# Patient Record
Sex: Female | Born: 1965 | Race: White | Hispanic: No | Marital: Married | Smoking: Never smoker
Health system: Southern US, Community
[De-identification: ages and names within clinical notes are randomized; demographics above are authoritative.]

## PROBLEM LIST (undated history)

## (undated) DIAGNOSIS — Z98811 Dental restoration status: Secondary | ICD-10-CM

## (undated) DIAGNOSIS — K589 Irritable bowel syndrome without diarrhea: Secondary | ICD-10-CM

## (undated) DIAGNOSIS — K7581 Nonalcoholic steatohepatitis (NASH): Secondary | ICD-10-CM

## (undated) DIAGNOSIS — E781 Pure hyperglyceridemia: Secondary | ICD-10-CM

## (undated) DIAGNOSIS — J309 Allergic rhinitis, unspecified: Secondary | ICD-10-CM

## (undated) DIAGNOSIS — K635 Polyp of colon: Secondary | ICD-10-CM

## (undated) DIAGNOSIS — R2230 Localized swelling, mass and lump, unspecified upper limb: Secondary | ICD-10-CM

## (undated) DIAGNOSIS — F419 Anxiety disorder, unspecified: Secondary | ICD-10-CM

## (undated) DIAGNOSIS — J189 Pneumonia, unspecified organism: Secondary | ICD-10-CM

## (undated) DIAGNOSIS — K5730 Diverticulosis of large intestine without perforation or abscess without bleeding: Secondary | ICD-10-CM

## (undated) DIAGNOSIS — Z87442 Personal history of urinary calculi: Secondary | ICD-10-CM

## (undated) DIAGNOSIS — I10 Essential (primary) hypertension: Secondary | ICD-10-CM

## (undated) DIAGNOSIS — N3010 Interstitial cystitis (chronic) without hematuria: Secondary | ICD-10-CM

## (undated) DIAGNOSIS — K219 Gastro-esophageal reflux disease without esophagitis: Secondary | ICD-10-CM

## (undated) DIAGNOSIS — K649 Unspecified hemorrhoids: Secondary | ICD-10-CM

## (undated) DIAGNOSIS — E282 Polycystic ovarian syndrome: Secondary | ICD-10-CM

## (undated) DIAGNOSIS — K8020 Calculus of gallbladder without cholecystitis without obstruction: Secondary | ICD-10-CM

## (undated) HISTORY — DX: Polyp of colon: K63.5

## (undated) HISTORY — PX: COLONOSCOPY: SHX174

## (undated) HISTORY — DX: Gastro-esophageal reflux disease without esophagitis: K21.9

## (undated) HISTORY — DX: Allergic rhinitis, unspecified: J30.9

## (undated) HISTORY — DX: Nonalcoholic steatohepatitis (NASH): K75.81

## (undated) HISTORY — DX: Interstitial cystitis (chronic) without hematuria: N30.10

## (undated) HISTORY — DX: Anxiety disorder, unspecified: F41.9

## (undated) HISTORY — DX: Irritable bowel syndrome, unspecified: K58.9

## (undated) HISTORY — PX: ABDOMINAL HYSTERECTOMY: SHX81

## (undated) HISTORY — DX: Polycystic ovarian syndrome: E28.2

## (undated) HISTORY — DX: Calculus of gallbladder without cholecystitis without obstruction: K80.20

## (undated) HISTORY — DX: Essential (primary) hypertension: I10

## (undated) HISTORY — PX: CHOLECYSTECTOMY: SHX55

## (undated) HISTORY — DX: Pneumonia, unspecified organism: J18.9

---

## 2000-08-30 ENCOUNTER — Ambulatory Visit (HOSPITAL_COMMUNITY): Admission: RE | Admit: 2000-08-30 | Discharge: 2000-08-30 | Payer: Self-pay | Admitting: Pulmonary Disease

## 2001-10-06 ENCOUNTER — Ambulatory Visit (HOSPITAL_COMMUNITY): Admission: RE | Admit: 2001-10-06 | Discharge: 2001-10-06 | Payer: Self-pay | Admitting: Pulmonary Disease

## 2004-11-03 ENCOUNTER — Ambulatory Visit (HOSPITAL_COMMUNITY): Admission: RE | Admit: 2004-11-03 | Discharge: 2004-11-03 | Payer: Self-pay | Admitting: Family Medicine

## 2004-11-15 ENCOUNTER — Ambulatory Visit (HOSPITAL_COMMUNITY): Admission: RE | Admit: 2004-11-15 | Discharge: 2004-11-15 | Payer: Self-pay | Admitting: Family Medicine

## 2004-11-24 ENCOUNTER — Ambulatory Visit (HOSPITAL_COMMUNITY): Admission: RE | Admit: 2004-11-24 | Discharge: 2004-11-24 | Payer: Self-pay | Admitting: General Surgery

## 2005-01-05 ENCOUNTER — Encounter (INDEPENDENT_AMBULATORY_CARE_PROVIDER_SITE_OTHER): Payer: Self-pay | Admitting: General Surgery

## 2005-01-05 ENCOUNTER — Observation Stay (HOSPITAL_COMMUNITY): Admission: RE | Admit: 2005-01-05 | Discharge: 2005-01-06 | Payer: Self-pay | Admitting: General Surgery

## 2005-01-05 HISTORY — PX: CHOLECYSTECTOMY: SHX55

## 2005-01-05 HISTORY — PX: LIVER BIOPSY: SHX301

## 2005-01-08 ENCOUNTER — Ambulatory Visit (HOSPITAL_COMMUNITY): Admission: RE | Admit: 2005-01-08 | Discharge: 2005-01-08 | Payer: Self-pay | Admitting: General Surgery

## 2005-02-26 ENCOUNTER — Ambulatory Visit: Payer: Self-pay | Admitting: Internal Medicine

## 2005-03-13 ENCOUNTER — Ambulatory Visit (HOSPITAL_COMMUNITY): Admission: RE | Admit: 2005-03-13 | Discharge: 2005-03-13 | Payer: Self-pay | Admitting: Family Medicine

## 2005-12-18 ENCOUNTER — Ambulatory Visit (HOSPITAL_COMMUNITY): Admission: RE | Admit: 2005-12-18 | Discharge: 2005-12-18 | Payer: Self-pay | Admitting: Family Medicine

## 2008-11-17 ENCOUNTER — Ambulatory Visit (HOSPITAL_COMMUNITY): Admission: RE | Admit: 2008-11-17 | Discharge: 2008-11-17 | Payer: Self-pay | Admitting: Family Medicine

## 2009-02-01 ENCOUNTER — Ambulatory Visit (HOSPITAL_COMMUNITY): Admission: RE | Admit: 2009-02-01 | Discharge: 2009-02-01 | Payer: Self-pay | Admitting: Family Medicine

## 2009-03-16 ENCOUNTER — Encounter (INDEPENDENT_AMBULATORY_CARE_PROVIDER_SITE_OTHER): Payer: Self-pay | Admitting: Obstetrics and Gynecology

## 2009-03-16 ENCOUNTER — Inpatient Hospital Stay (HOSPITAL_COMMUNITY): Admission: RE | Admit: 2009-03-16 | Discharge: 2009-03-18 | Payer: Self-pay | Admitting: Obstetrics and Gynecology

## 2009-03-16 HISTORY — PX: TOTAL ABDOMINAL HYSTERECTOMY W/ BILATERAL SALPINGOOPHORECTOMY: SHX83

## 2009-03-17 ENCOUNTER — Ambulatory Visit: Payer: Self-pay | Admitting: Gynecology

## 2010-06-12 ENCOUNTER — Encounter: Payer: Self-pay | Admitting: Family Medicine

## 2010-08-24 LAB — BASIC METABOLIC PANEL
BUN: 5 mg/dL — ABNORMAL LOW (ref 6–23)
CO2: 30 mEq/L (ref 19–32)
Creatinine, Ser: 0.7 mg/dL (ref 0.4–1.2)
GFR calc Af Amer: >60 mL/min (ref >60)
Potassium: 2.9 mEq/L — ABNORMAL LOW (ref 3.5–5.1)
Sodium: 135 mEq/L (ref 135–145)

## 2010-08-24 LAB — CBC
HCT: 41.8 % (ref 36.0–46.0)
Hemoglobin: 11.3 g/dL — ABNORMAL LOW (ref 12.0–15.0)
Hemoglobin: 14 g/dL (ref 12.0–15.0)
MCHC: 33.5 g/dL (ref 30.0–36.0)
MCHC: 34 g/dL (ref 30.0–36.0)
Platelets: 312 10*3/uL (ref 150–400)
RDW: 13.6 % (ref 11.5–15.5)
WBC: 8.7 10*3/uL (ref 4.0–10.5)

## 2010-10-06 NOTE — H&P (Signed)
NAMESKYLENE, DEREMER                 ACCOUNT NO.:  [id number]   MEDICAL RECORD NO.:  [id number]          PATIENT TYPE:  AMB   LOCATION:  DAY                           FACILITY:  [facility]   PHYSICIAN:  Jerolyn Shin C. Katrinka Blazing, M.D.   DATE OF BIRTH:  12-28-1965   DATE OF ADMISSION:  DATE OF DISCHARGE:  LH                                HISTORY & PHYSICAL   A 45 year old female with a history of elevated liver function studies since  March, 2006.  She is hepatitis B and C negative.  She has had multiple  gallstones.  She has had occasional episodes of right upper quadrant  postprandial pain.  She has not had severe attacks.  The patient had an MRCP  which shows no evidence of ductal stones.  No dilated ducts.  No pancreatic  masses.  She has fatty changes of the liver.  She is scheduled to have  laparoscopic cholecystectomy and liver biopsy.  Cholangiogram will not be  done because of totally normal MRCP within the last month.   PAST MEDICAL HISTORY:  She has hypertension and seasonal allergies.   Her only medication is Dyazide 1 daily.   ALLERGIES:  None.   PAST SURGICAL HISTORY:  C-section x2.   SOCIAL HISTORY:  She is married and employed.  Does not drink, smoke, or use  drugs.   FAMILY HISTORY:  Positive for hypertension, diabetes mellitus, kidney  failure, leukemia, and a stroke.   PHYSICAL EXAMINATION:  VITAL SIGNS:  Blood pressure 150/90, pulse 90,  respirations 20. Weight 209 pounds.  HEENT:  Unremarkable.  There is no jaundice.  NECK:  Supple.  No JVD, bruit, adenopathy, or thyromegaly.  CHEST:  Clear to auscultation.  HEART:  Regular rate and rhythm without murmur, rub or gallop.  ABDOMEN:  Soft and nontender.  No masses.  EXTREMITIES:  No clubbing, cyanosis or edema.  NEUROLOGIC:  No focal motor, sensory, or cerebellar deficit.   IMPRESSION:  1.  Cholelithiasis and chronic cholecystitis.  2.  Chronically elevated liver function tests.  3.  Hypertension.   PLAN:  Laparoscopic  cholecystectomy with liver biopsy.      Dirk Dress. Katrinka Blazing, M.D.  Electronically Signed     LCS/MEDQ  D:  01/04/2005  T:  01/04/2005  Job:  [id number]   cc:   Donna Bernard, M.D.  [address]  [place]  [place] [address]  Fax: [phone]

## 2010-10-06 NOTE — Procedures (Signed)
[facility]  Patient:    TIRZA, SENTENO Visit Number: [id number] MRN: [id number]          Service Type: OUT Location: RAD Attending Physician:  Fredirick Maudlin Dictated by:   Deweese Bing, M.D. Proc. Date: 10/06/01 Admit Date:  10/06/2001 Discharge Date: 10/06/2001                              Echocardiograms  INDICATIONS:  A 45 year old woman with edema and hypertension.  1. Technically adequate echocardiographic study. 2. Normal left atrium, right atrium, and right ventricle. 3. Normal aortic, mitral, tricuspid, and pulmonic valves.  Trivial tricuspid    regurgitation with normal estimated RV systolic pressure. 4. Normal internal dimension of the left ventricle, borderline concentric LVH,    normal regional and global LV systolic function. Dictated by:   [place] Bing, M.D. Attending Physician:  Fredirick Maudlin DD:  10/06/01 TD:  10/08/01 Job: [id number]

## 2010-10-06 NOTE — Op Note (Signed)
Isabella Juarez, Isabella Juarez                 ACCOUNT NO.:  [id number]   MEDICAL RECORD NO.:  [id number]          PATIENT TYPE:  OBV   LOCATION:  A303                          FACILITY:  [facility]   PHYSICIAN:  Dirk Dress. Katrinka Blazing, M.D.   DATE OF BIRTH:  August 30, 1965   DATE OF PROCEDURE:  01/05/2005  DATE OF DISCHARGE:                                 OPERATIVE REPORT   PREOPERATIVE DIAGNOSIS:  Cholelithiasis, cholecystitis, chronically elevated  liver function studies.   POSTOPERATIVE DIAGNOSIS:  Cholelithiasis, cholecystitis, with probable fatty  infiltration of the liver.   PROCEDURE:  Laparoscopic cholecystectomy with liver biopsy.   SURGEON:  Dr. Katrinka Blazing.   INDICATIONS:  A 45 year old female with history of gallstone disease with  documented gallstones but also with chronically elevated LFTs. She had a  negative MRCP without any evidence of ductal dilatation or stones in the  duct.   DESCRIPTION:  Under general anesthesia, the patient's abdomen was prepped  and draped in sterile field. Supraumbilical incision was made, and Veress  needle was inserted uneventfully. Abdomen was insufflated with 3 liters of  CO2. Using a Visiport guide, a 10-mm port was placed. Laparoscope was  placed, and gallbladder was visualized. Under videoscopic guidance, a 10-mm  port and two 5-mm ports were placed in the right subcostal region. The  gallbladder was grasped and positioned. The patient was positioned in  reversed Trendelenburg position. Cystic duct was dissected, clipped with  five clips and divided. The cystic artery had two branches. Each was  dissected, clipped with four clips and divided. Using electrocautery,  gallbladder was separated from the intrahepatic bed without difficulty.  Next, the abdomen was desufflated. A small incision was made in the right  upper quadrant, and a Tru-Cut needle was passed through this incision. Under  videoscopic guidance, two ____________ of tissue were taken from the right  lobe of the liver. The area of the liver biopsy were cauterized until there  was no bleeding. Irrigation was carried out until the fluid returned clear.  The bed was inspected. It was clear of any bleeding. The gallbladder was  placed in an EndoCatch device and retrieved. Reinspection revealed no  evidence of bleeding and no evidence of bile leak. CO2 was allowed to escape  from the abdomen, and the ports were removed. The incisions  were closed using 0 Vicryl on the fascia of the supraumbilical incision and  staples on the skin. Dressings were placed. The patient was awakened from  anesthesia uneventfully, transferred to a bed, and taken to the post  anesthetic care unit for further monitoring.      Dirk Dress. Katrinka Blazing, M.D.  Electronically Signed     LCS/MEDQ  D:  01/05/2005  T:  01/05/2005  Job:  [id number]   cc:   Donna Bernard, M.D.  [address]  [place]  [place] [address]  Fax: [phone]

## 2011-03-14 ENCOUNTER — Other Ambulatory Visit: Payer: Self-pay | Admitting: Family Medicine

## 2011-03-14 DIAGNOSIS — R748 Abnormal levels of other serum enzymes: Secondary | ICD-10-CM

## 2011-03-20 ENCOUNTER — Other Ambulatory Visit: Payer: Self-pay | Admitting: Family Medicine

## 2011-03-20 ENCOUNTER — Ambulatory Visit (HOSPITAL_COMMUNITY)
Admission: RE | Admit: 2011-03-20 | Discharge: 2011-03-20 | Disposition: A | Discharge status: Home / Self Care | Payer: BC Managed Care – PPO | Source: Ambulatory Visit | Attending: Family Medicine | Admitting: Family Medicine

## 2011-03-20 DIAGNOSIS — R748 Abnormal levels of other serum enzymes: Secondary | ICD-10-CM

## 2011-03-20 DIAGNOSIS — R160 Hepatomegaly, not elsewhere classified: Secondary | ICD-10-CM | POA: Insufficient documentation

## 2011-04-23 ENCOUNTER — Encounter (INDEPENDENT_AMBULATORY_CARE_PROVIDER_SITE_OTHER): Payer: Self-pay | Admitting: Internal Medicine

## 2011-04-23 ENCOUNTER — Ambulatory Visit (INDEPENDENT_AMBULATORY_CARE_PROVIDER_SITE_OTHER): Payer: BC Managed Care – PPO | Admitting: Internal Medicine

## 2011-04-23 DIAGNOSIS — K7689 Other specified diseases of liver: Secondary | ICD-10-CM

## 2011-04-23 DIAGNOSIS — K760 Fatty (change of) liver, not elsewhere classified: Secondary | ICD-10-CM

## 2011-04-23 DIAGNOSIS — J209 Acute bronchitis, unspecified: Secondary | ICD-10-CM

## 2011-04-23 DIAGNOSIS — E669 Obesity, unspecified: Secondary | ICD-10-CM

## 2011-04-23 MED ORDER — AZITHROMYCIN 250 MG PO TABS: ORAL_TABLET | ORAL | Status: AC

## 2011-04-23 NOTE — Consult Note (Signed)
[id number]

## 2011-04-23 NOTE — Patient Instructions (Signed)
Ultrasound guided liver biopsy to be scheduled. Continue with current lifestyle modifications including exercise and dietary change. Vitamin C 500 mg by mouth daily. Vitamin E4 100 units by mouth daily.

## 2011-04-23 NOTE — Consult Note (Signed)
Isabella Juarez, DANISH                 ACCOUNT NO.:  [id number]  MEDICAL RECORD NO.:  [id number]  LOCATION:  XRAY                          FACILITY:  [facility]  PHYSICIAN:  Lionel December, M.D.    DATE OF BIRTH:  05/24/1965  DATE OF CONSULTATION:  04/23/2011 DATE OF DISCHARGE:  03/20/2011                                CONSULTATION   REASON FOR CONSULTATION:  Persistently elevated transaminases in a patient with biopsy-proven steatohepatitis and elevated serum ferritin.  HISTORY OF PRESENT ILLNESS:  Isabella Juarez is a 45 year old Caucasian female who is referred through courtesy of Dr. Lubertha South for GI evaluation.  The patient underwent laparoscopic cholecystectomy for symptomatic cholelithiasis in August 2006.  She was noted to have fatty liver.  She, therefore had liver biopsy at the time of surgery.  Biopsy reveals focal portal fibrosis with scattered lymphocytes and granulocytes in the lobular parenchyma.  It also showed significant fatty metamorphosis. The patient was then referred to Dr. Jena Gauss for a GI consultation.  The patient was seen by him in October 2006.  The patient was advised lifestyle modifications.  Unfortunately, she did not return for followup visit.  The patient states that her LFTs are always normal until she had hysterectomy which was in 2010.  She does not understand why.  Her hysterectomy was in October 2010.  She was recently evaluated by Dr. Lubertha South and had blood work.  Her AST and ALT were markedly elevated at 275 and 338 respectively and her AP was 120.  She had viral markers studies as well as iron studies and an ultrasound.  She was subsequently referred for further evaluation. Since then, she has had her LFTs repeated on April 06, 2011, and her AST was down to 116 and ALT was down to 175.  The patient states that since she found her transaminase was very high, she has changed her eating habits.  She has cut out the red meat and quit drinking soft drinks  altogether.  She is also trying to walk more than she has.  She is walking at least 3-5 times each week while at work.  It is usually 1 mile at that time.  There is no history of transfusion, tattoos or icteric hepatitis.  She recalls that she did donate blood in 1983 and did not hear anything from [employer].  She did receive vaccination for hepatitis A and B about 15 years ago.  She has very good appetite.  She denies abdominal pain, pruritus, skin rash or abdominal pain.  She states that this is the most that she has ever weighed.  When she graduated from the high school, she was 120 pounds and 15 years ago she was 144 pounds.  Today, she is also complaining of tender lymph nodes at her neck, sore throat and repeated bouts of coughing and she has had low-grade fever. She did call Dr. Fletcher Anon office, but was not able to get an appointment for today.  She denies wheezing or shortness of breath.  CURRENT MEDICATIONS: 1. Citalopram 40 mg p.o. daily. 2. Hydrochlorothiazide 25 mg p.o. daily. 3. Losartan 50 mg p.o. daily. 4. KCl 10 mEq p.o. daily.  PAST MEDICAL HISTORY:  Obesity for over 10 years.  Hypertension for about 10 years duration.  She had mood swings felt to be related to hormones and she was intolerant of estradiol, but doing well on citalopram.  She had C- section in 1989 and again in 1992.  She had laparoscopic cholecystectomy in August 2006, for gallstones.  She had hysterectomy in 2010.  She also had her ovaries removed at that time.  She received vaccination for hepatitis A and B several years ago.  ALLERGIES:  NK.  FAMILY HISTORY:  Father had renal transplant and died of metastatic cancer related to immunosuppressive therapy at age 67.  Further details not available.  Mother at 34 is in good health.  Brother died during infancy.  SOCIAL HISTORY:  She is married.  She has 2 children in good health. 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  She has never drank alcohol or smoke cigarettes.  PHYSICAL EXAMINATION:  VITAL SIGNS:  Weight 235 pounds, she is 65 inches tall, pulse 84 per minute, blood pressure 120/82, respiratory rate is 14 and temp is 99.3. GENERAL:  The patient is repeatedly coughing. EYES:  Conjunctivae are pink.  Sclerae are nonicteric. MOUTH:  Oropharyngeal mucosa is normal. NECK:  No neck masses or thyromegaly noted. CARDIAC:  With regular rhythm.  Normal S1 and S2.  No murmur or gallop noted. LUNGS:  Clear to auscultation. ABDOMEN:  Protuberant, bowel sounds are normal on palpation, soft abdomen without tenderness or organomegaly noted.  Liver edge is 3-4 cm, below ostium is soft.  Span is 15-16 cm. EXTREMITIES:  No peripheral edema or clubbing noted.  LABORATORY DATA:  LFTs from February 23, 2011 and April 06, 2011 as above.  From March 14, 2011, serum iron 53, TIBC 340, saturation 16%, serum ferritin 472 (10/291).  Hepatitis B surface antigen negative, hepatitis C virus antibody negative.  Upper abdominal ultrasound March 20, 2011, liver is echogenic and felt to be enlarged, but no dimensions provided.  Portal vein is patent. There is normal hepatopetal flow.  Bile duct is 2.5-mm.  Gallbladder has been removed.  No mention of spleen.  ASSESSMENT:  Isabella Juarez is a 45 year old Caucasian female who was diagnosed with non-alcoholic fatty liver disease in August 2006, when she had liver biopsy at the time of laparoscopic cholecystectomy.  She had significant steatosis with some inflammation and most importantly she had focal portal fibrosis.  Given that, she did not pursue with lifestyle modifications.  One has to be worried that her fibrosis may have been progressive.  She was recently noted to have rather significant bump in her transaminases.  Since then, these have dropped. These still could be due to fatty liver.  The  patient's serum ferritin is mildly elevated, but iron studies revealed lowest saturation of 16%. I believe elevated serum ferritin is secondary to fatty liver disease and not indicative of hemochromatosis.  Similarly other diagnosis would be less likely.  I believe she should undergo another liver biopsy which will provide [place] a very critical information of whether or not her fibrosis has progressed and also rule out other conditions.  Pros and cons of liver biopsy reviewed with the patient and she is agreeable. The patient appears to have acute bronchitis. She has low-grade fever.  RECOMMENDATIONS:  The patient advised to increase the level of activity. She should try to walk at least 2 miles at a time  and at least 4 times a week.  She should continue to watch her calorie intake as she is already doing.  I would like for her to take vitamin C 500 mg p.o. daily and vitamin E 400 units daily which seemed to benefit some people with NAFLDs.  We will arrange for her to undergo ultrasound-guided liver biopsy as soon as she has recovered from her acute bronchitis.  I will also review the patient's ultrasound and find out whether or not spleen was examined.  Zithromax 500 mg p.o. today then 250 daily x4 days.  We appreciate the opportunity to participate in the care of this nice lady.          ______________________________ Lionel December, M.D.     NR/MEDQ  D:  04/23/2011  T:  04/23/2011  Job:  [id number]

## 2011-04-24 ENCOUNTER — Encounter (INDEPENDENT_AMBULATORY_CARE_PROVIDER_SITE_OTHER): Payer: Self-pay | Admitting: Internal Medicine

## 2011-04-24 NOTE — Progress Notes (Signed)
CONSULTATION  REASON FOR CONSULTATION: Persistently elevated transaminases in a  patient with biopsy-proven steatohepatitis and elevated serum ferritin.  HISTORY OF PRESENT ILLNESS: Isabella Juarez is a 45 year old Caucasian female who  is referred through courtesy of Dr. Lubertha South for GI evaluation. The  patient underwent laparoscopic cholecystectomy for symptomatic  cholelithiasis in August 2006. She was noted to have fatty liver. She,  therefore had liver biopsy at the time of surgery. Biopsy reveals focal  portal fibrosis with scattered lymphocytes and granulocytes in the  lobular parenchyma. It also showed significant fatty metamorphosis.  The patient was then referred to Dr. Jena Gauss for a GI consultation. The  patient was seen by him in October 2006. The patient was advised  lifestyle modifications. Unfortunately, she did not return for followup  visit. The patient states that her LFTs are always normal until she had  hysterectomy which was in 2010. She does not understand why. Her  hysterectomy was in October 2010.  She was recently evaluated by Dr. Lubertha South and had blood work. Her  AST and ALT were markedly elevated at 275 and 338 respectively and her  AP was 120. She had viral markers studies as well as iron studies and  an ultrasound. She was subsequently referred for further evaluation.  Since then, she has had her LFTs repeated on April 06, 2011, and her  AST was down to 116 and ALT was down to 175.  The patient states that since she found her transaminase was very high,  she has changed her eating habits. She has cut out the red meat and  quit drinking soft drinks altogether. She is also trying to walk more  than she has. She is walking at least 3-5 times each week while at  work. It is usually 1 mile at that time. There is no history of  transfusion, tattoos or icteric hepatitis. She recalls that she did  donate blood in 1983 and did not hear anything from [employer]. She did    receive vaccination for hepatitis A and B about 15 years ago. She has  very good appetite. She denies abdominal pain, pruritus, skin rash or  abdominal pain. She states that this is the most that she has ever  weighed. When she graduated from the high school, she was 120 pounds  and 15 years ago she was 144 pounds.  Today, she is also complaining of tender lymph nodes at her neck, sore  throat and repeated bouts of coughing and she has had low-grade fever.  She did call Dr. Fletcher Anon office, but was not able to get an appointment  for today. She denies wheezing or shortness of breath.  CURRENT MEDICATIONS:  1. Citalopram 40 mg p.o. daily.  2. Hydrochlorothiazide 25 mg p.o. daily.  3. Losartan 50 mg p.o. daily.  4. KCl 10 mEq p.o. daily.  PAST MEDICAL HISTORY: Obesity for over 10 years.  Hypertension for about 10 years duration.  She had mood swings felt to be related to hormones and she was  intolerant of estradiol, but doing well on citalopram. She had C-  section in 1989 and again in 1992. She had laparoscopic cholecystectomy  in August 2006, for gallstones. She had hysterectomy in 2010. She also  had her ovaries removed at that time.  She received vaccination for hepatitis A and B several years ago.  ALLERGIES: NK.  FAMILY HISTORY: Father had renal transplant and died of metastatic  cancer related to immunosuppressive therapy at age 81. Further  details  not available. Mother at 45 is in good health. Brother died during  infancy.  SOCIAL HISTORY: She is married. She has 2 children in good health.  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 She  has never drank alcohol or smoke cigarettes.  PHYSICAL EXAMINATION: VITAL SIGNS: Weight 235 pounds, she is 65 inches  tall, pulse 84 per minute, blood pressure 120/82, respiratory rate is 14  and temp is 99.3.  GENERAL: The patient is repeatedly  coughing.  EYES: Conjunctivae are pink. Sclerae are nonicteric.  MOUTH: Oropharyngeal mucosa is normal.  NECK: No neck masses or thyromegaly noted.  CARDIAC: With regular rhythm. Normal S1 and S2. No murmur or gallop  noted.  LUNGS: Clear to auscultation.  ABDOMEN: Protuberant, bowel sounds are normal on palpation, soft  abdomen without tenderness or organomegaly noted. Liver edge is 3-4 cm,  below ostium is soft. Span is 15-16 cm.  EXTREMITIES: No peripheral edema or clubbing noted.  LABORATORY DATA: LFTs from February 23, 2011 and April 06, 2011 as  above.  From March 14, 2011, serum iron 53, TIBC 340, saturation 16%, serum  ferritin 472 (10/291). Hepatitis B surface antigen negative, hepatitis  C virus antibody negative.  Upper abdominal ultrasound March 20, 2011, liver is echogenic and felt  to be enlarged, but no dimensions provided. Portal vein is patent.  There is normal hepatopetal flow. Bile duct is 2.5-mm. Gallbladder has  been removed. No mention of spleen.  ASSESSMENT: Isabella Juarez is a 45 year old Caucasian female who was diagnosed  with non-alcoholic fatty liver disease in August 2006, when she had  liver biopsy at the time of laparoscopic cholecystectomy. She had  significant steatosis with some inflammation and most importantly she  had focal portal fibrosis. Given that, she did not pursue with  lifestyle modifications. One has to be worried that her fibrosis may  have been progressive. She was recently noted to have rather  significant bump in her transaminases. Since then, these have dropped.  These still could be due to fatty liver. The patient's serum ferritin  is mildly elevated, but iron studies revealed lowest saturation of 16%.  I believe elevated serum ferritin is secondary to fatty liver disease  and not indicative of hemochromatosis. Similarly other diagnosis would  be less likely. I believe she should undergo another liver biopsy which  will provide [place] a  very critical information of whether or not her  fibrosis has progressed and also rule out other conditions. Pros and  cons of liver biopsy reviewed with the patient and she is agreeable.  The patient appears to have acute bronchitis. She has low-grade fever.  RECOMMENDATIONS: The patient advised to increase the level of activity.  She should try to walk at least 2 miles at a time and at least 4 times a  week. She should continue to watch her calories as she is already  doing.  I would like for her to take vitamin C 500 mg p.o. daily and vitamin E  400 units daily which seemed to benefit some people with NAFLDs.  We will arrange for her to undergo ultrasound-guided liver biopsy as  soon as she has recovered from her acute bronchitis.  I will also review the patient's ultrasound and find out whether or not  spleen was examined.  Zithromax 500 mg p.o. today then 250 daily x4 days.  We appreciate the opportunity to participate in  the care of this nice  lady.

## 2011-09-18 ENCOUNTER — Encounter (INDEPENDENT_AMBULATORY_CARE_PROVIDER_SITE_OTHER): Payer: Self-pay

## 2011-11-06 ENCOUNTER — Other Ambulatory Visit: Payer: Self-pay | Admitting: Obstetrics and Gynecology

## 2011-11-12 ENCOUNTER — Other Ambulatory Visit: Payer: Self-pay | Admitting: Obstetrics and Gynecology

## 2011-11-12 DIAGNOSIS — R928 Other abnormal and inconclusive findings on diagnostic imaging of breast: Secondary | ICD-10-CM

## 2011-11-23 ENCOUNTER — Ambulatory Visit
Admission: RE | Admit: 2011-11-23 | Discharge: 2011-11-23 | Disposition: A | Discharge status: Home / Self Care | Payer: BC Managed Care – PPO | Source: Ambulatory Visit | Attending: Obstetrics and Gynecology | Admitting: Obstetrics and Gynecology

## 2011-11-23 DIAGNOSIS — R928 Other abnormal and inconclusive findings on diagnostic imaging of breast: Secondary | ICD-10-CM

## 2011-12-17 ENCOUNTER — Other Ambulatory Visit: Payer: Self-pay | Admitting: Obstetrics and Gynecology

## 2011-12-17 DIAGNOSIS — D171 Benign lipomatous neoplasm of skin and subcutaneous tissue of trunk: Secondary | ICD-10-CM

## 2012-06-23 ENCOUNTER — Telehealth: Payer: Self-pay | Admitting: Internal Medicine

## 2012-06-23 NOTE — Telephone Encounter (Signed)
Forward 9 pages from [facility] Medicine to Dr. Stan Head for review on 06-23-12 ym

## 2012-07-11 ENCOUNTER — Encounter: Payer: Self-pay | Admitting: Internal Medicine

## 2012-07-31 ENCOUNTER — Encounter: Payer: BC Managed Care – PPO | Admitting: Gastroenterology

## 2012-08-05 ENCOUNTER — Ambulatory Visit (INDEPENDENT_AMBULATORY_CARE_PROVIDER_SITE_OTHER): Payer: BC Managed Care – PPO | Admitting: Gastroenterology

## 2012-08-05 ENCOUNTER — Encounter: Payer: Self-pay | Admitting: Gastroenterology

## 2012-08-05 VITALS — BP 132/76 | HR 88 | Ht 65.0 in | Wt 194.0 lb

## 2012-08-05 DIAGNOSIS — K589 Irritable bowel syndrome without diarrhea: Secondary | ICD-10-CM

## 2012-08-05 DIAGNOSIS — K602 Anal fissure, unspecified: Secondary | ICD-10-CM

## 2012-08-05 DIAGNOSIS — K625 Hemorrhage of anus and rectum: Secondary | ICD-10-CM

## 2012-08-05 MED ORDER — NITROGLYCERIN 0.4 % RE OINT: 1.0000 "application " | TOPICAL_OINTMENT | RECTAL | Status: DC

## 2012-08-05 MED ORDER — MOVIPREP 100 G PO SOLR: 1.0000 | Freq: Once | ORAL | Status: DC

## 2012-08-05 NOTE — Progress Notes (Signed)
.   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  She had had previous liver biopsy performed by Dr. Karilyn Cota .  She currently denies any hepatobiliary or upper gastrointestinal complaints.  For the last year she's had daily bright red blood per rectum with discomfort with a bowel movement and several soft bowel movements a day.  She's tried ProctoFoam and local anal creams without improvement.  She denies abdominal pain, anemia, fever, chills, skin rashes, joint pains, oral stomatitis.  Her appetite is good her weight is stable.  Family history is noncontributory.  Recent labs were entirely normal including liver function test and CBC.  His not had previous barium studies or colonoscopy.  I have reviewed this patient's present history, medical and surgical past history, allergies and medications.     ROS:   All systems were reviewed and are negative unless otherwise stated in the HPI.    Physical Exam: Blood pressure 132/76, pulse 88 and regular and weight 194 with a BMI of 32.28 General well developed well nourished patient in no acute distress, appearing their stated age Eyes PERRLA, no icterus, fundoscopic exam per opthamologist Skin no lesions noted Neck supple, no adenopathy, no thyroid enlargement, no tenderness Chest clear to percussion and auscultation Heart no significant murmurs, gallops or rubs noted Abdomen no hepatosplenomegaly masses or tenderness, BS normal.  Rectal inspection normal no fissures, or fistulae noted.  No masses or tenderness on digital exam. Stool guaiac negative. Extremities no acute joint lesions, edema, phlebitis or evidence of cellulitis. Neurologic patient oriented x 3, cranial nerves intact, no focal neurologic deficits noted. Psychological mental status normal and normal affect.  Anoscopy: There is a prominent  posterior fissure noted with a prior to feeding inflamed papilla and anal canal visible with retraction of the buttocks.  Otherwise I can see no perianal or perineal lesions.  Rectal exam shows some thickening of the anal canal posteriorly.  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  Assessment and plan: IBS-type diarrhea with associated anal fissure, rule out inflammatory bowel disease.  I have placed her on daily Metamucil, glycerin ointment twice a day to her rectum, and we'll schedule colonoscopy exam.  She has mild acid reflux controlled with when necessary Zantac.  Her liver function tests have normalized, she does not need repeat biopsy at this time.  She is to continue other medications listed and reviewed.  Encounter Diagnoses  Name Primary?  Marland Kitchen Anal fissure Yes  . Rectal bleeding

## 2012-08-05 NOTE — Patient Instructions (Addendum)
You have been scheduled for a colonoscopy with propofol. Please follow written instructions given to you at your visit today.  Please pick up your prep kit at the pharmacy within the next 1-3 days. If you use inhalers (even only as needed), please bring them with you on the day of your procedure.  Please follow diabetic instructions on your procedure instructions.  Please purchase Metamucil over the counter. Take as directed.  Sample of Rectiv (Nitroglycerin) given today, please use as directed. Please wear gloves when applying to your rectum.   _____________________________________________________________________________________________________________________  Isabella Juarez, Adult An anal fissure is a small tear or crack in the skin around the anus. Bleeding from a fissure usually stops on its own within a few minutes. However, bleeding will often reoccur with each bowel movement until the crack heals.  CAUSES   Passing large, hard stools.  Frequent diarrheal stools.  Constipation.  Inflammatory bowel disease (Crohn's disease or ulcerative colitis).  Infections.  Anal sex. SYMPTOMS   Small amounts of blood seen on your stools, on toilet paper, or in the toilet after a bowel movement.  Rectal bleeding.  Painful bowel movements.  Itching or irritation around the anus. DIAGNOSIS Your caregiver will examine the anal area. An anal fissure can usually be seen with careful inspection. A rectal exam may be performed and a short tube (anoscope) may be used to examine the anal canal. TREATMENT   You may be instructed to take fiber supplements. These supplements can soften your stool to help make bowel movements easier.  Sitz baths may be recommended to help heal the tear. Do not use soap in the sitz baths.  A medicated cream or ointment may be prescribed to lessen discomfort. HOME CARE INSTRUCTIONS   Maintain a diet high in fruits, whole grains, and vegetables. Avoid  constipating foods like bananas and dairy products.  Take sitz baths as directed by your caregiver.  Drink enough fluids to keep your urine clear or pale yellow.  Only take over-the-counter or prescription medicines for pain, discomfort, or fever as directed by your caregiver. Do not take aspirin as this may increase bleeding.  Do not use ointments containing numbing medications (anesthetics) or hydrocortisone. They could slow healing. SEEK MEDICAL CARE IF:   Your fissure is not completely healed within 3 days.  You have further bleeding.  You have a fever.  You have diarrhea mixed with blood.  You have pain.  Your problem is getting worse rather than better. MAKE SURE YOU:   Understand these instructions.  Will watch your condition.  Will get help right away if you are not doing well or get worse. Document Released: 05/07/2005 Document Revised: 07/30/2011 Document Reviewed: 10/22/2010 ExitCare Patient Information 2013 [name].  ________________________________________________________________________________________________________________                                               We are excited to introduce MyChart, a new best-in-class service that provides you online access to important information in your electronic medical record. We want to make it easier for you to view your health information - all in one secure location - when and where you need it. We expect MyChart will enhance the quality of care and service we provide.  When you register for MyChart, you can:    View your test results.    Request appointments and  receive appointment reminders via email.    Request medication renewals.    View your medical history, allergies, medications and immunizations.    Communicate with your physician's office through a password-protected site.    Conveniently print information such as your medication lists.  To find out if MyChart is right for you,  please talk to a member of our clinical staff today. We will gladly answer your questions about this free health and wellness tool.  If you are age 24 or older and want a member of your family to have access to your record, you must provide written consent by completing a proxy form available at our office. Please speak to our clinical staff about guidelines regarding accounts for patients younger than age 89.  As you activate your MyChart account and need any technical assistance, please call the MyChart technical support line at [phone]) or email your question to [email or web address]. If you email your question(s), please include your name, a return phone number and the best time to reach you.  If you have non-urgent health-related questions, you can send a message to our office through MyChart at [email or web address]. If you have a medical emergency, call 911.  Thank you for using MyChart as your new health and wellness resource!   MyChart licensed from [employer],  4540-9811. Patents Pending.

## 2012-08-12 ENCOUNTER — Telehealth: Payer: Self-pay | Admitting: Family Medicine

## 2012-08-12 ENCOUNTER — Other Ambulatory Visit: Payer: Self-pay | Admitting: Family Medicine

## 2012-08-12 DIAGNOSIS — B373 Candidiasis of vulva and vagina: Secondary | ICD-10-CM

## 2012-08-12 NOTE — Telephone Encounter (Signed)
Please call in

## 2012-08-12 NOTE — Telephone Encounter (Signed)
Nurse: Patient: Isabella Juarez dob [birth year] Pharm: [facility] Rx: yeast infection - diflucan CB: [id number] *Per the patient she reached out to the pharm. and they have not heard anything thing back from [facility] - it was called in about 8:30am 03-25 1:45pm

## 2012-08-12 NOTE — Telephone Encounter (Signed)
Diflucan 150 mg # 2 i po 3 days apart 1 refill. Called into [facility] pt notified.

## 2012-08-12 NOTE — Telephone Encounter (Signed)
Please get this done this eve. Needs diflucan--I'll give you rx to call in

## 2012-08-14 ENCOUNTER — Other Ambulatory Visit: Payer: Self-pay | Admitting: Obstetrics and Gynecology

## 2012-08-14 DIAGNOSIS — D171 Benign lipomatous neoplasm of skin and subcutaneous tissue of trunk: Secondary | ICD-10-CM

## 2012-08-14 DIAGNOSIS — R2232 Localized swelling, mass and lump, left upper limb: Secondary | ICD-10-CM

## 2012-08-18 ENCOUNTER — Other Ambulatory Visit: Payer: Self-pay | Admitting: Gastroenterology

## 2012-08-18 ENCOUNTER — Ambulatory Visit (AMBULATORY_SURGERY_CENTER): Payer: BC Managed Care – PPO | Admitting: Gastroenterology

## 2012-08-18 ENCOUNTER — Encounter: Payer: Self-pay | Admitting: Gastroenterology

## 2012-08-18 VITALS — BP 118/64 | HR 76 | Temp 98.4°F | Resp 16 | Ht 65.0 in | Wt 194.0 lb

## 2012-08-18 DIAGNOSIS — K602 Anal fissure, unspecified: Secondary | ICD-10-CM

## 2012-08-18 DIAGNOSIS — K625 Hemorrhage of anus and rectum: Secondary | ICD-10-CM

## 2012-08-18 DIAGNOSIS — D126 Benign neoplasm of colon, unspecified: Secondary | ICD-10-CM

## 2012-08-18 DIAGNOSIS — K5730 Diverticulosis of large intestine without perforation or abscess without bleeding: Secondary | ICD-10-CM

## 2012-08-18 DIAGNOSIS — Z1211 Encounter for screening for malignant neoplasm of colon: Secondary | ICD-10-CM

## 2012-08-18 HISTORY — PX: COLONOSCOPY W/ POLYPECTOMY: SHX1380

## 2012-08-18 HISTORY — DX: Diverticulosis of large intestine without perforation or abscess without bleeding: K57.30

## 2012-08-18 MED ORDER — SODIUM CHLORIDE 0.9 % IV SOLN: 500.0000 mL | INTRAVENOUS | Status: DC

## 2012-08-18 MED ORDER — HYDROCORTISONE ACE-PRAMOXINE 1-1 % RE CREA: TOPICAL_CREAM | Freq: Two times a day (BID) | RECTAL | Status: DC

## 2012-08-18 NOTE — Op Note (Signed)
[facility] [address] [place], [address]   COLONOSCOPY PROCEDURE REPORT  PATIENT: Isabella Juarez, Isabella Juarez  MR#: [id number] BIRTHDATE: 06/10/1965 , 47  yrs. old GENDER: Female ENDOSCOPIST: Mardella Layman, MD, Clementeen Graham REFERRED BY:  Lilyan Punt, M.D. PROCEDURE DATE:  08/18/2012 PROCEDURE:   Colonoscopy with snare polypectomy ASA CLASS:   Class II INDICATIONS:anal bleeding. MEDICATIONS: propofol (Diprivan) 200mg  IV  DESCRIPTION OF PROCEDURE:   After the risks and benefits and of the procedure were explained, informed consent was obtained.  A digital rectal exam revealed no abnormalities of the rectum.    The LB [id number]  endoscope was introduced through the anus and advanced to the cecum, which was identified by both the appendix and ileocecal valve .  The quality of the prep was adequate, using MoviPrep .  The instrument was then slowly withdrawn as the colon was fully examined.     COLON FINDINGS: A smooth sessile polyp ranging between 3-58mm in size was found at the cecum.  A polypectomy was performed with a cold snare.  The resection was complete and the polyp tissue was completely retrieved.   Moderate diverticulosis was noted in the descending colon and sigmoid colon.   Moderate sized external hemorrhoids were found.     Retroflexed views revealed external hemorrhoids.     The scope was then withdrawn from the patient and the procedure completed.  COMPLICATIONS: There were no complications. ENDOSCOPIC IMPRESSION: 1.   Sessile polyp ranging between 3-42mm in size was found at the cecum; polypectomy was performed with a cold snare 2.   Moderate diverticulosis was noted in the descending colon and sigmoid colon 3.   Moderate sized external hemorrhoids  RECOMMENDATIONS: 1.  Await biopsy results 2.  Repeat colonoscopy in 5 years if polyp adenomatous; otherwise 10 years 3.  High fiber diet   REPEAT  EXAM:  cc:  _______________________________ eSignedMardella Layman, MD, [facility] 08/18/2012 11:04 AM

## 2012-08-18 NOTE — Progress Notes (Signed)
Rechecked pts blood.  68 after 15 minutes of D5.  Left D5 hanging.

## 2012-08-18 NOTE — Progress Notes (Signed)
Patient did not experience any of the following events: a burn prior to discharge; a fall within the facility; wrong site/side/patient/procedure/implant event; or a hospital transfer or hospital admission upon discharge from the facility. (G8907) Patient did not have preoperative order for IV antibiotic SSI prophylaxis. (G8918)  

## 2012-08-18 NOTE — Patient Instructions (Addendum)
YOU HAD AN ENDOSCOPIC PROCEDURE TODAY AT THE [facility]: Refer to the procedure report that was given to you for any specific questions about what was found during the examination.  If the procedure report does not answer your questions, please call your gastroenterologist to clarify.  If you requested that your care partner not be given the details of your procedure findings, then the procedure report has been included in a sealed envelope for you to review at your convenience later.  YOU SHOULD EXPECT: Some feelings of bloating in the abdomen. Passage of more gas than usual.  Walking can help get rid of the air that was put into your GI tract during the procedure and reduce the bloating. If you had a lower endoscopy (such as a colonoscopy or flexible sigmoidoscopy) you may notice spotting of blood in your stool or on the toilet paper. If you underwent a bowel prep for your procedure, then you may not have a normal bowel movement for a few days.  DIET: Your first meal following the procedure should be a light meal and then it is ok to progress to your normal diet.  A half-sandwich or bowl of soup is an example of a good first meal.  Heavy or fried foods are harder to digest and may make you feel nauseous or bloated.  Likewise meals heavy in dairy and vegetables can cause extra gas to form and this can also increase the bloating.  Drink plenty of fluids but you should avoid alcoholic beverages for 24 hours.  ACTIVITY: Your care partner should take you home directly after the procedure.  You should plan to take it easy, moving slowly for the rest of the day.  You can resume normal activity the day after the procedure however you should NOT DRIVE or use heavy machinery for 24 hours (because of the sedation medicines used during the test).    SYMPTOMS TO REPORT IMMEDIATELY: A gastroenterologist can be reached at any hour.  During normal business hours, 8:30 AM to 5:00 PM Monday through Friday,  call [phone].  After hours and on weekends, please call the GI answering service at [phone] who will take a message and have the physician on call contact you.   Following lower endoscopy (colonoscopy or flexible sigmoidoscopy):  Excessive amounts of blood in the stool  Significant tenderness or worsening of abdominal pains  Swelling of the abdomen that is new, acute  Fever of 100F or higher  Following upper endoscopy (EGD)  Vomiting of blood or coffee ground material  New chest pain or pain under the shoulder blades  Painful or persistently difficult swallowing  New shortness of breath  Fever of 100F or higher  Black, tarry-looking stools  FOLLOW UP: If any biopsies were taken you will be contacted by phone or by letter within the next 1-3 weeks.  Call your gastroenterologist if you have not heard about the biopsies in 3 weeks.  Our staff will call the home number listed on your records the next business day following your procedure to check on you and address any questions or concerns that you may have at that time regarding the information given to you following your procedure. This is a courtesy call and so if there is no answer at the home number and we have not heard from you through the emergency physician on call, we will assume that you have returned to your regular daily activities without incident.  SIGNATURES/CONFIDENTIALITY: You and/or your care   partner have signed paperwork which will be entered into your electronic medical record.  These signatures attest to the fact that that the information above on your After Visit Summary has been reviewed and is understood.  Full responsibility of the confidentiality of this discharge information lies with you and/or your care-partner.  

## 2012-08-18 NOTE — Progress Notes (Signed)
Called to room to assist during endoscopic procedure.  Patient ID and intended procedure confirmed with present staff. Received instructions for my participation in the procedure from the performing physician.  

## 2012-08-19 ENCOUNTER — Telehealth: Payer: Self-pay | Admitting: *Deleted

## 2012-08-19 DIAGNOSIS — R2230 Localized swelling, mass and lump, unspecified upper limb: Secondary | ICD-10-CM

## 2012-08-19 HISTORY — DX: Localized swelling, mass and lump, unspecified upper limb: R22.30

## 2012-08-19 NOTE — Telephone Encounter (Signed)
Left message on number given in admitting to return call if problems or concerns. ewm 

## 2012-08-22 ENCOUNTER — Encounter: Payer: Self-pay | Admitting: Gastroenterology

## 2012-08-26 ENCOUNTER — Ambulatory Visit
Admission: RE | Admit: 2012-08-26 | Discharge: 2012-08-26 | Disposition: A | Discharge status: Home / Self Care | Payer: BC Managed Care – PPO | Source: Ambulatory Visit | Attending: Obstetrics and Gynecology | Admitting: Obstetrics and Gynecology

## 2012-08-26 DIAGNOSIS — R2232 Localized swelling, mass and lump, left upper limb: Secondary | ICD-10-CM

## 2012-08-27 ENCOUNTER — Ambulatory Visit (INDEPENDENT_AMBULATORY_CARE_PROVIDER_SITE_OTHER): Payer: BC Managed Care – PPO | Admitting: Surgery

## 2012-08-27 ENCOUNTER — Encounter (INDEPENDENT_AMBULATORY_CARE_PROVIDER_SITE_OTHER): Payer: Self-pay | Admitting: Surgery

## 2012-08-27 VITALS — BP 112/82 | HR 91 | Temp 96.1°F | Ht 65.0 in | Wt 195.2 lb

## 2012-08-27 DIAGNOSIS — R2232 Localized swelling, mass and lump, left upper limb: Secondary | ICD-10-CM

## 2012-08-27 DIAGNOSIS — R229 Localized swelling, mass and lump, unspecified: Secondary | ICD-10-CM

## 2012-08-27 DIAGNOSIS — R2230 Localized swelling, mass and lump, unspecified upper limb: Secondary | ICD-10-CM | POA: Insufficient documentation

## 2012-08-27 NOTE — Progress Notes (Signed)
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 We can do as an OP general anesthesia. Risks and complications discussed. Patient want to schedule                 CC:  Chief Complaint  Patient presents with  . Mass    eval Lt axillary mass    HPI:  Isabella Juarez is a 47 y.o.  female who presents for evaluation of enlarging left axillary mass. It was thought to be a lipoma. However, it has enlarged. It has become painful. An MRI was recommended last year but was not done because the patient decided not to do it further notice from her OB/GYN. However she now wishes to have it removed should she become symptomatic and larger. She's never had any other breast problems. PMH:  has a past medical history of Hypertension; Hypercholesterolemia; NASH (nonalcoholic steatohepatitis); Diabetes mellitus; Anxiety; Interstitial cystitis; and GERD (gastroesophageal reflux disease).  PSH:   has past surgical history that includes Abdominal hysterectomy (2010); Cholecystectomy (01/2007); Liver biopsy (2008); and Cesarean section (08/02/1987 , 12/15/1990).  ALLERGIES:  No Known Allergies  MEDICATIONS: Current outpatient prescriptions:AMBULATORY NON FORMULARY MEDICATION, Mega Red 300 One tablet by mouth at bedtime, Disp: , Rfl: ;  AMBULATORY NON FORMULARY MEDICATION, 5 hour energy shots, Disp: , Rfl: ;  Cholecalciferol (VITAMIN D3) 2000 UNITS capsule, Take 2,000 Units by mouth daily., Disp: , Rfl: ;  citalopram (CELEXA) 40 MG tablet, Take 40 mg by mouth daily.  , Disp: , Rfl:  Docusate Calcium (STOOL SOFTENER PO), Take 1 capsule by mouth daily., Disp: , Rfl: ;  fenofibrate 160 MG tablet, Take 160 mg by mouth daily., Disp: , Rfl: ;  folic  acid (FOLVITE) 400 MCG tablet, Take 400 mcg by mouth daily., Disp: , Rfl: ;  HYDROCHLOROTHIAZIDE PO, Take 25 mg by mouth daily.  , Disp: , Rfl: ;  Liraglutide (VICTOZA) 18 MG/3ML SOLN injection, Inject 1.8 mg into the skin daily., Disp: , Rfl:  losartan (COZAAR) 50 MG tablet, Take 50 mg by mouth daily.  , Disp: , Rfl: ;  metFORMIN (GLUCOPHAGE) 500 MG tablet, Take 500 mg by mouth daily with breakfast. , Disp: , Rfl: ;  Multiple Vitamins-Minerals (MULTIVITAMIN PO), Take by mouth. Gummy, Disp: , Rfl: ;  Nitroglycerin (RECTIV) 0.4 % OINT, Place 1 application rectally as directed., Disp: 30 g, Rfl: 0 Phentermine-Topiramate (QSYMIA) 11.25-69 MG CP24, Take 1 capsule by mouth daily., Disp: , Rfl: ;  pramoxine-hydrocortisone (PROCTOCREAM-HC) 1-1 % rectal cream, Place 1 application rectally 2 (two) times daily., Disp: , Rfl: ;  ranitidine (ZANTAC) 300 MG capsule, Take 300 mg by mouth every evening., Disp: , Rfl: ;  vitamin C (ASCORBIC ACID) 500 MG tablet, Take 500 mg by mouth daily., Disp: , Rfl:  vitamin E 400 UNIT capsule,  Take 400 Units by mouth daily., Disp: , Rfl:   ROS: She has filled out our 12 point review of systems and it is negative . EXAM:    VITAL SIGNS:  BP 112/82  Pulse 91  Temp(Src) 96.1 F (35.6 C) (Temporal)  Ht 5\' 5"  (1.651 m)  Wt 195 lb 3.2 oz (88.542 kg)  BMI 32.48 kg/m2  SpO2 98%  GENERAL:  The patient is alert, oriented, and generally healthy-appearing, NAD. Mood and affect are normal.  HEENT:  The head is normocephalic, the eyes nonicteric, the pupils were round regular and equal. EOMs are normal. Pharynx normal. Dentition good.  NECK:  The neck is supple and there are no masses or thyromegaly.  LUNGS: Normal respirations and clear to auscultation.  HEART: Regular rhythm, with no murmurs rubs or gallops. Pulses are intact carotid dorsalis pedis and posterior tibial. No significant varicosities are noted.  BREASTS: Normal bilaterally  LYMPHATICS: there is no axillary  or supraclavicular adenopathy on either side. There is a left axillary mass that is 10X5 cm, firm, not hard, not attached deep, produces a "flap" appearance.   ABDOMEN: Soft, flat, and nontender. No masses or organomegaly is noted. No hernias are noted. Bowel sounds are normal.  EXTREMITIES:  Good range of motion, no edema.   DATA REVIEWED:  I have reviewed the mammograms and the reports form her other physicians    Esdras Delair J 08/27/2012  CC: Merlyn Albert, MD, Harlow Asa, MD

## 2012-08-27 NOTE — Patient Instructions (Signed)
We wills schedule surgery to remove the mass under your left armpit

## 2012-08-28 ENCOUNTER — Encounter (HOSPITAL_BASED_OUTPATIENT_CLINIC_OR_DEPARTMENT_OTHER): Payer: Self-pay | Admitting: *Deleted

## 2012-08-29 ENCOUNTER — Other Ambulatory Visit: Payer: BC Managed Care – PPO

## 2012-09-04 ENCOUNTER — Ambulatory Visit (HOSPITAL_BASED_OUTPATIENT_CLINIC_OR_DEPARTMENT_OTHER)
Admission: RE | Admit: 2012-09-04 | Discharge: 2012-09-04 | Disposition: A | Discharge status: Home / Self Care | Payer: BC Managed Care – PPO | Source: Ambulatory Visit | Attending: Surgery | Admitting: Surgery

## 2012-09-04 ENCOUNTER — Encounter (HOSPITAL_BASED_OUTPATIENT_CLINIC_OR_DEPARTMENT_OTHER): Payer: Self-pay | Admitting: Certified Registered Nurse Anesthetist

## 2012-09-04 ENCOUNTER — Ambulatory Visit (HOSPITAL_BASED_OUTPATIENT_CLINIC_OR_DEPARTMENT_OTHER): Payer: BC Managed Care – PPO | Admitting: Certified Registered Nurse Anesthetist

## 2012-09-04 ENCOUNTER — Encounter (HOSPITAL_BASED_OUTPATIENT_CLINIC_OR_DEPARTMENT_OTHER)
Admission: RE | Disposition: A | Discharge status: Home / Self Care | Payer: Self-pay | Source: Ambulatory Visit | Attending: Surgery

## 2012-09-04 DIAGNOSIS — D1779 Benign lipomatous neoplasm of other sites: Secondary | ICD-10-CM | POA: Insufficient documentation

## 2012-09-04 DIAGNOSIS — R2232 Localized swelling, mass and lump, left upper limb: Secondary | ICD-10-CM

## 2012-09-04 DIAGNOSIS — E119 Type 2 diabetes mellitus without complications: Secondary | ICD-10-CM | POA: Insufficient documentation

## 2012-09-04 DIAGNOSIS — I10 Essential (primary) hypertension: Secondary | ICD-10-CM | POA: Insufficient documentation

## 2012-09-04 DIAGNOSIS — D1739 Benign lipomatous neoplasm of skin and subcutaneous tissue of other sites: Secondary | ICD-10-CM

## 2012-09-04 DIAGNOSIS — Z79899 Other long term (current) drug therapy: Secondary | ICD-10-CM | POA: Insufficient documentation

## 2012-09-04 HISTORY — DX: Dental restoration status: Z98.811

## 2012-09-04 HISTORY — DX: Unspecified hemorrhoids: K64.9

## 2012-09-04 HISTORY — DX: Localized swelling, mass and lump, unspecified upper limb: R22.30

## 2012-09-04 HISTORY — PX: MASS EXCISION: SHX2000

## 2012-09-04 HISTORY — DX: Pure hyperglyceridemia: E78.1

## 2012-09-04 HISTORY — DX: Diverticulosis of large intestine without perforation or abscess without bleeding: K57.30

## 2012-09-04 LAB — GLUCOSE, CAPILLARY: Glucose-Capillary: 101 mg/dL — ABNORMAL HIGH (ref 70–99)

## 2012-09-04 SURGERY — EXCISION MASS
Anesthesia: General | Site: Axilla | Laterality: Left | Wound class: Clean

## 2012-09-04 MED ORDER — LACTATED RINGERS IV SOLN
INTRAVENOUS | Status: DC
Start: 2012-09-04 — End: 2012-09-04
  Administered 2012-09-04: 07:00:00 via INTRAVENOUS
  Administered 2012-09-04: 20 mL/h via INTRAVENOUS

## 2012-09-04 MED ORDER — BUPIVACAINE HCL (PF) 0.25 % IJ SOLN
INTRAMUSCULAR | Status: DC | PRN
  Administered 2012-09-04: 20 mL

## 2012-09-04 MED ORDER — MIDAZOLAM HCL 5 MG/5ML IJ SOLN
INTRAMUSCULAR | Status: DC | PRN
  Administered 2012-09-04: 2 mg via INTRAVENOUS

## 2012-09-04 MED ORDER — CEFAZOLIN SODIUM-DEXTROSE 2-3 GM-% IV SOLR
2.0000 g | INTRAVENOUS | Status: AC
  Administered 2012-09-04: 2 g via INTRAVENOUS

## 2012-09-04 MED ORDER — CHLORHEXIDINE GLUCONATE 4 % EX LIQD: 1.0000 "application " | Freq: Once | CUTANEOUS | Status: DC

## 2012-09-04 MED ORDER — MIDAZOLAM HCL 2 MG/ML PO SYRP: 12.0000 mg | ORAL_SOLUTION | Freq: Once | ORAL | Status: DC | PRN

## 2012-09-04 MED ORDER — FENTANYL CITRATE 0.05 MG/ML IJ SOLN
INTRAMUSCULAR | Status: DC | PRN
  Administered 2012-09-04: 50 ug via INTRAVENOUS
  Administered 2012-09-04: 25 ug via INTRAVENOUS
  Administered 2012-09-04: 50 ug via INTRAVENOUS

## 2012-09-04 MED ORDER — MIDAZOLAM HCL 2 MG/2ML IJ SOLN: 1.0000 mg | INTRAMUSCULAR | Status: DC | PRN

## 2012-09-04 MED ORDER — HYDROMORPHONE HCL PF 1 MG/ML IJ SOLN
0.2500 mg | INTRAMUSCULAR | Status: DC | PRN
  Administered 2012-09-04 (×2): 0.5 mg via INTRAVENOUS
  Administered 2012-09-04: 0.25 mg via INTRAVENOUS

## 2012-09-04 MED ORDER — HYDROCODONE-ACETAMINOPHEN 5-325 MG PO TABS: 1.0000 | ORAL_TABLET | ORAL | Status: DC | PRN

## 2012-09-04 MED ORDER — 0.9 % SODIUM CHLORIDE (POUR BTL) OPTIME
TOPICAL | Status: DC | PRN
  Administered 2012-09-04: 250 mL

## 2012-09-04 MED ORDER — OXYCODONE HCL 5 MG PO TABS
5.0000 mg | ORAL_TABLET | Freq: Once | ORAL | Status: AC | PRN
  Administered 2012-09-04: 5 mg via ORAL

## 2012-09-04 MED ORDER — EPHEDRINE SULFATE 50 MG/ML IJ SOLN
INTRAMUSCULAR | Status: DC | PRN
  Administered 2012-09-04: 10 mg via INTRAVENOUS

## 2012-09-04 MED ORDER — DEXAMETHASONE SODIUM PHOSPHATE 4 MG/ML IJ SOLN
INTRAMUSCULAR | Status: DC | PRN
  Administered 2012-09-04: 4 mg via INTRAVENOUS

## 2012-09-04 MED ORDER — LIDOCAINE HCL (CARDIAC) 20 MG/ML IV SOLN
INTRAVENOUS | Status: DC | PRN
  Administered 2012-09-04: 60 mg via INTRAVENOUS

## 2012-09-04 MED ORDER — FENTANYL CITRATE 0.05 MG/ML IJ SOLN: 50.0000 ug | INTRAMUSCULAR | Status: DC | PRN

## 2012-09-04 MED ORDER — ONDANSETRON HCL 4 MG/2ML IJ SOLN: 4.0000 mg | Freq: Once | INTRAMUSCULAR | Status: DC | PRN

## 2012-09-04 MED ORDER — PROPOFOL 10 MG/ML IV BOLUS
INTRAVENOUS | Status: DC | PRN
  Administered 2012-09-04: 200 mg via INTRAVENOUS

## 2012-09-04 MED ORDER — OXYCODONE HCL 5 MG/5ML PO SOLN: 5.0000 mg | Freq: Once | ORAL | Status: AC | PRN

## 2012-09-04 MED ORDER — ONDANSETRON HCL 4 MG/2ML IJ SOLN
INTRAMUSCULAR | Status: DC | PRN
  Administered 2012-09-04: 4 mg via INTRAVENOUS

## 2012-09-04 SURGICAL SUPPLY — 45 items
ADH SKN CLS APL DERMABOND .7 (GAUZE/BANDAGES/DRESSINGS) ×1
BANDAGE ELASTIC 4 VELCRO ST LF (GAUZE/BANDAGES/DRESSINGS) IMPLANT
BLADE HEX COATED 2.75 (ELECTRODE) ×2 IMPLANT
BLADE SURG 15 STRL LF DISP TIS (BLADE) ×1 IMPLANT
BLADE SURG 15 STRL SS (BLADE) ×2
CANISTER SUCTION 1200CC (MISCELLANEOUS) ×1 IMPLANT
CHLORAPREP W/TINT 26ML (MISCELLANEOUS) ×2 IMPLANT
COVER MAYO STAND STRL (DRAPES) ×2 IMPLANT
COVER TABLE BACK 60X90 (DRAPES) ×2 IMPLANT
DECANTER SPIKE VIAL GLASS SM (MISCELLANEOUS) ×1 IMPLANT
DERMABOND ADVANCED (GAUZE/BANDAGES/DRESSINGS) ×1
DERMABOND ADVANCED .7 DNX12 (GAUZE/BANDAGES/DRESSINGS) IMPLANT
DRAPE EXTREMITY T 121X128X90 (DRAPE) IMPLANT
DRAPE PED LAPAROTOMY (DRAPES) ×1 IMPLANT
DRAPE UTILITY XL STRL (DRAPES) ×2 IMPLANT
DRSG TEGADERM 4X4.75 (GAUZE/BANDAGES/DRESSINGS) IMPLANT
ELECT REM PT RETURN 9FT ADLT (ELECTROSURGICAL) ×2
ELECTRODE REM PT RTRN 9FT ADLT (ELECTROSURGICAL) ×1 IMPLANT
GAUZE SPONGE 4X4 12PLY STRL LF (GAUZE/BANDAGES/DRESSINGS) IMPLANT
GAUZE SPONGE 4X4 16PLY XRAY LF (GAUZE/BANDAGES/DRESSINGS) IMPLANT
GLOVE BIO SURGEON STRL SZ7 (GLOVE) ×1 IMPLANT
GLOVE BIOGEL PI IND STRL 7.0 (GLOVE) IMPLANT
GLOVE BIOGEL PI INDICATOR 7.0 (GLOVE) ×2
GLOVE ECLIPSE 7.0 STRL STRAW (GLOVE) ×1 IMPLANT
GLOVE EUDERMIC 7 POWDERFREE (GLOVE) ×2 IMPLANT
GLOVE EXAM NITRILE MD LF STRL (GLOVE) ×1 IMPLANT
GOWN PREVENTION PLUS XLARGE (GOWN DISPOSABLE) ×5 IMPLANT
NDL HYPO 25X1 1.5 SAFETY (NEEDLE) IMPLANT
NEEDLE HYPO 25X1 1.5 SAFETY (NEEDLE) ×2 IMPLANT
NS IRRIG 1000ML POUR BTL (IV SOLUTION) ×2 IMPLANT
PACK BASIN DAY SURGERY FS (CUSTOM PROCEDURE TRAY) ×2 IMPLANT
PEN SKIN MARKING BROAD TIP (MISCELLANEOUS) ×2 IMPLANT
PENCIL BUTTON HOLSTER BLD 10FT (ELECTRODE) ×2 IMPLANT
SHEET MEDIUM DRAPE 40X70 STRL (DRAPES) ×1 IMPLANT
SLEEVE SCD COMPRESS KNEE MED (MISCELLANEOUS) ×1 IMPLANT
SPONGE LAP 4X18 X RAY DECT (DISPOSABLE) ×1 IMPLANT
STAPLER VISISTAT 35W (STAPLE) IMPLANT
SUT MNCRL AB 4-0 PS2 18 (SUTURE) ×1 IMPLANT
SUT VICRYL 3-0 CR8 SH (SUTURE) ×1 IMPLANT
SYR BULB 3OZ (MISCELLANEOUS) ×1 IMPLANT
SYR CONTROL 10ML LL (SYRINGE) ×1 IMPLANT
TOWEL OR 17X24 6PK STRL BLUE (TOWEL DISPOSABLE) ×3 IMPLANT
TOWEL OR NON WOVEN STRL DISP B (DISPOSABLE) ×2 IMPLANT
TUBE CONNECTING 20X1/4 (TUBING) ×1 IMPLANT
YANKAUER SUCT BULB TIP NO VENT (SUCTIONS) ×1 IMPLANT

## 2012-09-04 NOTE — Anesthesia Preprocedure Evaluation (Addendum)
Anesthesia Evaluation  Patient identified by MRN, date of birth, ID band Patient awake    Reviewed: Allergy & Precautions, NPO status   Airway Mallampati: I TM Distance: >3 FB Neck ROM: Full    Dental  (+) Teeth Intact and Dental Advisory Given   Pulmonary  breath sounds clear to auscultation        Cardiovascular hypertension, Pt. on medications Rhythm:Regular Rate:Normal     Neuro/Psych    GI/Hepatic   Endo/Other  diabetes, Well Controlled, Type 2, Oral Hypoglycemic Agents  Renal/GU      Musculoskeletal   Abdominal   Peds  Hematology   Anesthesia Other Findings   Reproductive/Obstetrics                         Anesthesia Physical Anesthesia Plan  ASA: III  Anesthesia Plan: General   Post-op Pain Management:    Induction: Intravenous  Airway Management Planned: LMA  Additional Equipment:   Intra-op Plan:   Post-operative Plan: Extubation in OR  Informed Consent: I have reviewed the patients History and Physical, chart, labs and discussed the procedure including the risks, benefits and alternatives for the proposed anesthesia with the patient or authorized representative who has indicated his/her understanding and acceptance.   Dental advisory given  Plan Discussed with: CRNA, Anesthesiologist and Surgeon  Anesthesia Plan Comments:         Anesthesia Quick Evaluation

## 2012-09-04 NOTE — Op Note (Signed)
Isabella Juarez 1966-03-25 [id number] 08/27/2012  Preoperative diagnosis: left axillary mass, probable lipoma  Postoperative diagnosis: same  Procedure: excision of left axillary mass (7 cm incision, layered closure)  Surgeon: Currie Paris, MD, FACS  Anesthesia: General   Clinical History and Indications: this patient has a gradually enlarging left axillary mass that was soft. It had produced a proboscis appearance in the axilla and interfered with the motion of her arm when it was at her side. It has been enlarging and excision was requested.    Description of Procedure: I saw the patient in the preoperative area. The plans for surgery were confirmed, the mass was identified and marked, and all questions were answered. The patient was taken to the operating room. After satisfactory general anesthesia was obtained the left axillary and breast area were prepped and draped. A timeout was done.  I made a 7 cm elliptical incision so that all the excess skin to be removed along with the subcutaneous mass. I was able to identify what appeared to be a well-circumscribed lipoma and with blunt dissection get under it. It was excised completely. I irrigated and made sure everything was dry. I put 20 cc of 0.25% plain Marcaine in for postop pain relief. I irrigated again and then closed with several layers of 3-0 Vicryl followed by for4-0 Monocryl subcuticular and Dermabond on the skin.  The patient tolerated procedure well. There were no complications. Counts were correct. Blood loss was minimal.  Currie Paris, MD, FACS 09/04/2012 8:49 AM

## 2012-09-04 NOTE — Interval H&P Note (Signed)
History and Physical Interval Note:  09/04/2012 8:09 AM  Isabella Juarez  has presented today for surgery, with the diagnosis of left axillary mass   The various methods of treatment have been discussed with the patient and family. After consideration of risks, benefits and other options for treatment, the patient has consented to  Procedure(s): EXCISION MASS left axillary 10 cm (Left) as a surgical intervention .  The patient's history has been reviewed, patient examined, no change in status, stable for surgery.  I have reviewed the patient's chart and labs.  Questions were answered to the patient's satisfaction.   The left axilla is marked as the operative site  Pristine Gladhill J

## 2012-09-04 NOTE — Anesthesia Postprocedure Evaluation (Signed)
  Anesthesia Post-op Note  Patient: Isabella Juarez  Procedure(s) Performed: Procedure(s): Excision Left Axillary Mass 10 cm (Left)  Patient Location: PACU  Anesthesia Type:General  Level of Consciousness: awake, alert  and oriented  Airway and Oxygen Therapy: Patient Spontanous Breathing  Post-op Pain: mild  Post-op Assessment: Post-op Vital signs reviewed  Post-op Vital Signs: Reviewed  Complications: No apparent anesthesia complications

## 2012-09-04 NOTE — Anesthesia Procedure Notes (Signed)
Procedure Name: LMA Insertion Date/Time: 09/04/2012 8:09 AM Performed by: Audelia Knape D Pre-anesthesia Checklist: Patient identified, Emergency Drugs available, Suction available and Patient being monitored Patient Re-evaluated:Patient Re-evaluated prior to inductionOxygen Delivery Method: Circle System Utilized Preoxygenation: Pre-oxygenation with 100% oxygen Intubation Type: IV induction Ventilation: Mask ventilation without difficulty LMA: LMA inserted LMA Size: 4.0 Number of attempts: 1 Airway Equipment and Method: bite block Placement Confirmation: positive ETCO2 Tube secured with: Tape Dental Injury: Teeth and Oropharynx as per pre-operative assessment

## 2012-09-04 NOTE — Transfer of Care (Signed)
Immediate Anesthesia Transfer of Care Note  Patient: Isabella Juarez  Procedure(s) Performed: Procedure(s): Excision Left Axillary Mass 10 cm (Left)  Patient Location: PACU  Anesthesia Type:General  Level of Consciousness: awake and patient cooperative  Airway & Oxygen Therapy: Patient Spontanous Breathing and Patient connected to face mask oxygen  Post-op Assessment: Report given to PACU RN and Post -op Vital signs reviewed and stable  Post vital signs: Reviewed and stable  Complications: No apparent anesthesia complications

## 2012-09-04 NOTE — H&P (View-Only) (Signed)
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 We can do as an OP general anesthesia. Risks and complications discussed. Patient want to schedule                 CC:  Chief Complaint  Patient presents with  . Mass    eval Lt axillary mass    HPI:  Isabella Juarez is a 47 y.o.  female who presents for evaluation of enlarging left axillary mass. It was thought to be a lipoma. However, it has enlarged. It has become painful. An MRI was recommended last year but was not done because the patient decided not to do it further notice from her OB/GYN. However she now wishes to have it removed should she become symptomatic and larger. She's never had any other breast problems. PMH:  has a past medical history of Hypertension; Hypercholesterolemia; NASH (nonalcoholic steatohepatitis); Diabetes mellitus; Anxiety; Interstitial cystitis; and GERD (gastroesophageal reflux disease).  PSH:   has past surgical history that includes Abdominal hysterectomy (2010); Cholecystectomy (01/2007); Liver biopsy (2008); and Cesarean section (08/02/1987 , 12/15/1990).  ALLERGIES:  No Known Allergies  MEDICATIONS: Current outpatient prescriptions:AMBULATORY NON FORMULARY MEDICATION, Mega Red 300 One tablet by mouth at bedtime, Disp: , Rfl: ;  AMBULATORY NON FORMULARY MEDICATION, 5 hour energy shots, Disp: , Rfl: ;  Cholecalciferol (VITAMIN D3) 2000 UNITS capsule, Take 2,000 Units by mouth daily., Disp: , Rfl: ;  citalopram (CELEXA) 40 MG tablet, Take 40 mg by mouth daily.  , Disp: , Rfl:  Docusate Calcium (STOOL SOFTENER PO), Take 1 capsule by mouth daily., Disp: , Rfl: ;  fenofibrate 160 MG tablet, Take 160 mg by mouth daily., Disp: , Rfl: ;  folic  acid (FOLVITE) 400 MCG tablet, Take 400 mcg by mouth daily., Disp: , Rfl: ;  HYDROCHLOROTHIAZIDE PO, Take 25 mg by mouth daily.  , Disp: , Rfl: ;  Liraglutide (VICTOZA) 18 MG/3ML SOLN injection, Inject 1.8 mg into the skin daily., Disp: , Rfl:  losartan (COZAAR) 50 MG tablet, Take 50 mg by mouth daily.  , Disp: , Rfl: ;  metFORMIN (GLUCOPHAGE) 500 MG tablet, Take 500 mg by mouth daily with breakfast. , Disp: , Rfl: ;  Multiple Vitamins-Minerals (MULTIVITAMIN PO), Take by mouth. Gummy, Disp: , Rfl: ;  Nitroglycerin (RECTIV) 0.4 % OINT, Place 1 application rectally as directed., Disp: 30 g, Rfl: 0 Phentermine-Topiramate (QSYMIA) 11.25-69 MG CP24, Take 1 capsule by mouth daily., Disp: , Rfl: ;  pramoxine-hydrocortisone (PROCTOCREAM-HC) 1-1 % rectal cream, Place 1 application rectally 2 (two) times daily., Disp: , Rfl: ;  ranitidine (ZANTAC) 300 MG capsule, Take 300 mg by mouth every evening., Disp: , Rfl: ;  vitamin C (ASCORBIC ACID) 500 MG tablet, Take 500 mg by mouth daily., Disp: , Rfl:  vitamin E 400 UNIT capsule,   Take 400 Units by mouth daily., Disp: , Rfl:   ROS: She has filled out our 12 point review of systems and it is negative . EXAM:    VITAL SIGNS:  BP 112/82  Pulse 91  Temp(Src) 96.1 F (35.6 C) (Temporal)  Ht 5' 5" (1.651 m)  Wt 195 lb 3.2 oz (88.542 kg)  BMI 32.48 kg/m2  SpO2 98%  GENERAL:  The patient is alert, oriented, and generally healthy-appearing, NAD. Mood and affect are normal.  HEENT:  The head is normocephalic, the eyes nonicteric, the pupils were round regular and equal. EOMs are normal. Pharynx normal. Dentition good.  NECK:  The neck is supple and there are no masses or thyromegaly.  LUNGS: Normal respirations and clear to auscultation.  HEART: Regular rhythm, with no murmurs rubs or gallops. Pulses are intact carotid dorsalis pedis and posterior tibial. No significant varicosities are noted.  BREASTS: Normal bilaterally  LYMPHATICS: there is no axillary  or supraclavicular adenopathy on either side. There is a left axillary mass that is 10X5 cm, firm, not hard, not attached deep, produces a "flap" appearance.   ABDOMEN: Soft, flat, and nontender. No masses or organomegaly is noted. No hernias are noted. Bowel sounds are normal.  EXTREMITIES:  Good range of motion, no edema.   DATA REVIEWED:  I have reviewed the mammograms and the reports form her other physicians    Kina Shiffman J 08/27/2012  CC: Luking, William S, MD, LUKING,W S, MD       

## 2012-09-08 ENCOUNTER — Telehealth (INDEPENDENT_AMBULATORY_CARE_PROVIDER_SITE_OTHER): Payer: Self-pay | Admitting: General Surgery

## 2012-09-08 ENCOUNTER — Encounter (HOSPITAL_BASED_OUTPATIENT_CLINIC_OR_DEPARTMENT_OTHER): Payer: Self-pay | Admitting: Surgery

## 2012-09-08 LAB — POCT I-STAT, CHEM 8
Chloride: 99 mEq/L (ref 96–112)
Glucose, Bld: 95 mg/dL (ref 70–99)
HCT: 38 % (ref 36.0–46.0)
Hemoglobin: 12.9 g/dL (ref 12.0–15.0)
Potassium: 2.9 mEq/L — ABNORMAL LOW (ref 3.5–5.1)

## 2012-09-08 NOTE — Telephone Encounter (Signed)
Message copied by Liliana Cline on Mon Sep 08, 2012  9:14 AM ------      Message from: Currie Paris      Created: Fri Sep 05, 2012  4:40 PM       Tell her path is benign and as expected ------

## 2012-09-08 NOTE — Telephone Encounter (Signed)
Left message on machine for patient to call back and ask for me. To make her aware pathology is benign.

## 2012-09-08 NOTE — Telephone Encounter (Signed)
Patient called back and was given pathology was benign.

## 2012-09-16 ENCOUNTER — Telehealth: Payer: Self-pay | Admitting: Family Medicine

## 2012-09-16 NOTE — Telephone Encounter (Signed)
macrobid 100 bid for 7 d

## 2012-09-16 NOTE — Telephone Encounter (Signed)
Patient would like to see if she can get something called in for a bladder infection. She saw Eber Jones last week for this, but has not cleared up.  [facility]

## 2012-09-16 NOTE — Telephone Encounter (Signed)
Symptoms got better but just started back yesterday. Finished cipro. Frequent urination, slight burning with urination, no fever, no low back pain, abd pressure but not pain. Can something be called into [facility].

## 2012-09-16 NOTE — Telephone Encounter (Signed)
macrobid 100 bid for 7 d called into [facility]. Pt notified on voicemail

## 2012-09-17 ENCOUNTER — Encounter: Payer: Self-pay | Admitting: *Deleted

## 2012-09-19 ENCOUNTER — Encounter (INDEPENDENT_AMBULATORY_CARE_PROVIDER_SITE_OTHER): Payer: Self-pay | Admitting: Surgery

## 2012-09-19 ENCOUNTER — Encounter (INDEPENDENT_AMBULATORY_CARE_PROVIDER_SITE_OTHER): Payer: BC Managed Care – PPO | Admitting: Surgery

## 2012-09-19 ENCOUNTER — Ambulatory Visit (INDEPENDENT_AMBULATORY_CARE_PROVIDER_SITE_OTHER): Payer: BC Managed Care – PPO | Admitting: Surgery

## 2012-09-19 VITALS — BP 118/86 | HR 64 | Resp 16 | Ht 65.0 in | Wt 191.0 lb

## 2012-09-19 DIAGNOSIS — R229 Localized swelling, mass and lump, unspecified: Secondary | ICD-10-CM

## 2012-09-19 DIAGNOSIS — Z09 Encounter for follow-up examination after completed treatment for conditions other than malignant neoplasm: Secondary | ICD-10-CM

## 2012-09-19 DIAGNOSIS — R2232 Localized swelling, mass and lump, left upper limb: Secondary | ICD-10-CM

## 2012-09-19 NOTE — Progress Notes (Signed)
NAME: Isabella Juarez                                            DOB: 07/17/1965 DATE: 09/19/2012                                                  MRN: [id number]  CC:  Chief Complaint  Patient presents with  . Routine Post Op    excision left axillary mass 09/04/2012    HPI: This patient comes in for post op follow-up .Sheunderwent removal of a large left axillary lipoma on 09/04/12. She feels that she is doing well.  PE:  VITAL SIGNS: BP 118/86  Pulse 64  Resp 16  Ht 5\' 5"  (1.651 m)  Wt 191 lb (86.637 kg)  BMI 31.78 kg/m2  General: The patient appears to be healthy, NAD Incision is healing nicely, no infection. There is some prominent fat at the anterior edge of the axilla, but it appears to just be sub q tissue as is similar to the right. I think some post op reaction as well.   DATA REVIEWED: Diagnosis Soft tissue mass, simple excision, Left axillary - MATURE ADIPOSE TISSUE CONSISTENT WITH LIPOMA. - BENIGN SKIN PRESENT. - NO DYSPLASIA, ATYPIA OR MALIGNANCY IDENTIFIED. Zandra Abts MD Pathologist, Electronic  IMPRESSION: The patient is doing well S/P excision of lipoma left axilla; prminent fatty tissue remaining anteriorly.    PLAN: RTC PRN. Told her if the prominent tissue does not shrink or remaims botersome after two to three months to let me see her again. Also gave her a copy of the path and discussed with her

## 2012-09-19 NOTE — Patient Instructions (Signed)
We will see you again on an as needed basis. Please call the office at [phone] if you have any questions or concerns. Thank you for allowing us to take care of you.  

## 2012-10-21 ENCOUNTER — Ambulatory Visit (INDEPENDENT_AMBULATORY_CARE_PROVIDER_SITE_OTHER): Payer: BC Managed Care – PPO | Admitting: Family Medicine

## 2012-10-21 ENCOUNTER — Encounter: Payer: Self-pay | Admitting: Family Medicine

## 2012-10-21 VITALS — BP 112/80 | Temp 98.3°F | Ht 65.0 in | Wt 194.0 lb

## 2012-10-21 DIAGNOSIS — N390 Urinary tract infection, site not specified: Secondary | ICD-10-CM

## 2012-10-21 LAB — POCT URINALYSIS DIPSTICK
Blood, UA: 50
Nitrite, UA: POSITIVE
pH, UA: 5

## 2012-10-21 MED ORDER — SULFAMETHOXAZOLE-TRIMETHOPRIM 800-160 MG PO TABS: 1.0000 | ORAL_TABLET | Freq: Two times a day (BID) | ORAL | Status: AC

## 2012-10-21 NOTE — Progress Notes (Signed)
  Subjective:    Patient ID: SADEY YANDELL, female    DOB: 01/11/1966, 47 y.o.   MRN: [id number]  Urinary Tract Infection  This is a chronic problem. The current episode started more than 1 month ago. The problem occurs every urination. The problem has been gradually worsening. The pain is at a severity of 5/10. The pain is moderate. There has been no fever. There is no history of pyelonephritis. Treatments tried: azo. The treatment provided no relief.   Patient frustrated because of persistence of symptoms.   Review of Systems No fever no headache no vomiting no abdominal pain other than low abdominal discomfort. ROS otherwise negative.    Objective:   Physical Exam Alert no acute distress. HEENT normal. Lungs clear. Heart regular rate and rhythm. No CVA tenderness. UA 60 white blood cells per high-power field       Assessment & Plan:  Impression UTI. Plan antibiotics prescribed. Symptomatic care discussed. Culture urine. Rationale discussed.

## 2012-10-21 NOTE — Patient Instructions (Signed)
Call us on Friday for culture results

## 2012-10-23 LAB — URINE CULTURE

## 2012-12-12 ENCOUNTER — Other Ambulatory Visit: Payer: Self-pay | Admitting: Family Medicine

## 2013-02-13 ENCOUNTER — Telehealth: Payer: Self-pay | Admitting: Family Medicine

## 2013-02-13 NOTE — Telephone Encounter (Signed)
Patient hasn't had office visit for this in over 6 mths. Patient to call back and schedule an office visit next week.

## 2013-02-13 NOTE — Telephone Encounter (Signed)
Confused. Medication history says a different dose.  Please clarify.

## 2013-02-13 NOTE — Telephone Encounter (Signed)
Pt needs refill on her Qysmia, she let it run out, she misread the date.  Last Rx was for the 15/92, please send to [facility] in [place] and call pt when done

## 2013-02-20 ENCOUNTER — Other Ambulatory Visit: Payer: Self-pay | Admitting: Obstetrics and Gynecology

## 2013-07-03 ENCOUNTER — Encounter: Payer: Self-pay | Admitting: Nurse Practitioner

## 2013-07-03 ENCOUNTER — Ambulatory Visit (INDEPENDENT_AMBULATORY_CARE_PROVIDER_SITE_OTHER): Payer: BC Managed Care – PPO | Admitting: Nurse Practitioner

## 2013-07-03 VITALS — BP 132/96 | Temp 98.8°F | Ht 64.0 in | Wt 217.0 lb

## 2013-07-03 DIAGNOSIS — J111 Influenza due to unidentified influenza virus with other respiratory manifestations: Secondary | ICD-10-CM

## 2013-07-03 DIAGNOSIS — E669 Obesity, unspecified: Secondary | ICD-10-CM

## 2013-07-03 DIAGNOSIS — B9789 Other viral agents as the cause of diseases classified elsewhere: Secondary | ICD-10-CM

## 2013-07-03 DIAGNOSIS — B349 Viral infection, unspecified: Secondary | ICD-10-CM

## 2013-07-03 MED ORDER — HYDROCODONE-HOMATROPINE 5-1.5 MG/5ML PO SYRP: 5.0000 mL | ORAL_SOLUTION | ORAL | Status: DC | PRN

## 2013-07-03 MED ORDER — OSELTAMIVIR PHOSPHATE 75 MG PO CAPS: 75.0000 mg | ORAL_CAPSULE | Freq: Two times a day (BID) | ORAL | Status: DC

## 2013-07-03 MED ORDER — PHENTERMINE HCL 37.5 MG PO TABS: 37.5000 mg | ORAL_TABLET | Freq: Every day | ORAL | Status: DC

## 2013-07-03 NOTE — Patient Instructions (Signed)

## 2013-07-09 ENCOUNTER — Encounter: Payer: Self-pay | Admitting: Nurse Practitioner

## 2013-07-09 ENCOUNTER — Telehealth: Payer: Self-pay | Admitting: Family Medicine

## 2013-07-09 NOTE — Progress Notes (Signed)
Subjective:  Presents for c/o fever, headache, sore throat, cough, chills, ear pain, fatigue, and myalgias that began yest. Had 2 doses of Augmentin at home that she has taken. Slight wheeze when lying down. Is due for her labs at work this spring. Requesting to restart phentermine, has taken this without difficulty in the past.   Objective:   BP 132/96  Temp(Src) 98.8 F (37.1 C) (Oral)  Ht 5\' 4"  (1.626 m)  Wt 217 lb (98.431 kg)  BMI 37.23 kg/m2 NAD. Alert, oriented. Fatigued in appearance.TMs minimal clear effusion, no erythema. Pharynx clear. Neck supple with mild soft adenopathy. Lungs clear. Heart RRR.  Assessment: Problem List Items Addressed This Visit     Other   Obesity   Relevant Medications      phentermine (ADIPEX-P) 37.5 MG tablet    Other Visit Diagnoses   Viral illness    -  Primary    Relevant Medications       oseltamivir (TAMIFLU) capsule    Influenza           Plan:  Meds ordered this encounter  Medications  . diclofenac (VOLTAREN) 75 MG EC tablet    Sig:   . oseltamivir (TAMIFLU) 75 MG capsule    Sig: Take 1 capsule (75 mg total) by mouth 2 (two) times daily.    Dispense:  10 capsule    Refill:  0    Order Specific Question:  Supervising Provider    Answer:  Mikey Kirschner [[id number]]  . HYDROcodone-homatropine (HYCODAN) 5-1.5 MG/5ML syrup    Sig: Take 5 mLs by mouth every 4 (four) hours as needed.    Dispense:  120 mL    Refill:  0    Order Specific Question:  Supervising Provider    Answer:  Mikey Kirschner [[id number]]  . phentermine (ADIPEX-P) 37.5 MG tablet    Sig: Take 1 tablet (37.5 mg total) by mouth daily before breakfast.    Dispense:  30 tablet    Refill:  2    Order Specific Question:  Supervising Provider    Answer:  Maggie Font  Influenza-the patient was diagnosed with influenza. Patient/family educated about the flu and warning signs to watch for. If difficulty breathing, severe neck pain and stiffness, cyanosis,  disorientation, or progressive worsening then immediately get rechecked at that ER. If progressive symptoms be certain to be rechecked. Supportive measures such as Tylenol/ibuprofen was discussed. No aspirin use in children. And influenza home care instruction sheet was given. Follow up in spring, bring labs with her.

## 2013-07-09 NOTE — Telephone Encounter (Signed)
Pt seen on the 13th by Hoyle Sauer, not feeling any better, was given tamiflu Symptoms now are: chest congestion, cough  Can we call something in for this to [facility]

## 2013-07-10 MED ORDER — CEFDINIR 300 MG PO CAPS: 300.0000 mg | ORAL_CAPSULE | Freq: Two times a day (BID) | ORAL | Status: DC

## 2013-07-10 NOTE — Telephone Encounter (Signed)
omnicef 300 bid ten d 

## 2013-07-10 NOTE — Telephone Encounter (Signed)
Patient calling to check on this. She said that she has been out all week for work. She says she is not getting any better.

## 2013-07-10 NOTE — Telephone Encounter (Signed)
Rx sent electronically to pharmacy. Patient notified. 

## 2013-07-10 NOTE — Telephone Encounter (Signed)
She is hoping one of the docs will handle this for her.

## 2013-08-13 ENCOUNTER — Ambulatory Visit (INDEPENDENT_AMBULATORY_CARE_PROVIDER_SITE_OTHER): Payer: BC Managed Care – PPO | Admitting: Nurse Practitioner

## 2013-08-13 ENCOUNTER — Encounter: Payer: Self-pay | Admitting: Nurse Practitioner

## 2013-08-13 VITALS — BP 124/88 | Ht 64.0 in | Wt 219.2 lb

## 2013-08-13 DIAGNOSIS — K7689 Other specified diseases of liver: Secondary | ICD-10-CM

## 2013-08-13 DIAGNOSIS — F32A Depression, unspecified: Secondary | ICD-10-CM | POA: Insufficient documentation

## 2013-08-13 DIAGNOSIS — F419 Anxiety disorder, unspecified: Secondary | ICD-10-CM

## 2013-08-13 DIAGNOSIS — E785 Hyperlipidemia, unspecified: Secondary | ICD-10-CM

## 2013-08-13 DIAGNOSIS — F329 Major depressive disorder, single episode, unspecified: Secondary | ICD-10-CM | POA: Insufficient documentation

## 2013-08-13 DIAGNOSIS — E669 Obesity, unspecified: Secondary | ICD-10-CM

## 2013-08-13 DIAGNOSIS — K760 Fatty (change of) liver, not elsewhere classified: Secondary | ICD-10-CM

## 2013-08-13 DIAGNOSIS — E119 Type 2 diabetes mellitus without complications: Secondary | ICD-10-CM

## 2013-08-13 DIAGNOSIS — F341 Dysthymic disorder: Secondary | ICD-10-CM

## 2013-08-13 LAB — POCT GLYCOSYLATED HEMOGLOBIN (HGB A1C): HEMOGLOBIN A1C: 5.4

## 2013-08-13 MED ORDER — CITALOPRAM HYDROBROMIDE 40 MG PO TABS: 40.0000 mg | ORAL_TABLET | Freq: Every day | ORAL | Status: DC

## 2013-08-13 MED ORDER — ALBIGLUTIDE 50 MG ~~LOC~~ PEN: PEN_INJECTOR | SUBCUTANEOUS | Status: DC

## 2013-08-13 MED ORDER — METFORMIN HCL 500 MG PO TABS: 500.0000 mg | ORAL_TABLET | Freq: Two times a day (BID) | ORAL | Status: DC

## 2013-08-13 MED ORDER — LOSARTAN POTASSIUM 50 MG PO TABS: 50.0000 mg | ORAL_TABLET | Freq: Every day | ORAL | Status: DC

## 2013-08-13 MED ORDER — RANITIDINE HCL 300 MG PO TABS: 300.0000 mg | ORAL_TABLET | Freq: Every day | ORAL | Status: DC

## 2013-08-13 MED ORDER — DICLOFENAC SODIUM 75 MG PO TBEC: 75.0000 mg | DELAYED_RELEASE_TABLET | Freq: Two times a day (BID) | ORAL | Status: DC

## 2013-08-13 MED ORDER — HYDROCHLOROTHIAZIDE 25 MG PO TABS: 25.0000 mg | ORAL_TABLET | Freq: Every day | ORAL | Status: DC

## 2013-08-13 NOTE — Patient Instructions (Signed)
Vertical sleeve gastrectomy 

## 2013-08-17 ENCOUNTER — Telehealth: Payer: Self-pay | Admitting: Family Medicine

## 2013-08-17 ENCOUNTER — Encounter: Payer: Self-pay | Admitting: Nurse Practitioner

## 2013-08-17 MED ORDER — HYDROCHLOROTHIAZIDE 25 MG PO TABS: 25.0000 mg | ORAL_TABLET | Freq: Every day | ORAL | Status: DC

## 2013-08-17 NOTE — Telephone Encounter (Signed)
Patient said that Isabella Juarez forgot to call in her fenofibrate and her HCTZ. She needs this refilled please.   [employer]

## 2013-08-17 NOTE — Progress Notes (Signed)
Subjective:  Presents for routine followup. Has been taking her Victoza daily. Stopped Qsymia since she was not losing any weight. Stop her fenofibrate, denies any adverse effects. Was "worried about her liver". Rare reflux well controlled with ranitidine, associated with certain foods. Regular visits with podiatrist. Depression and anxiety are stable. Has not temporary well with her diet. Limited exercise. Would like to try new medication Tanzeum.  Objective:   BP 124/88  Ht 5\' 4"  (1.626 m)  Wt 219 lb 4 oz (99.451 kg)  BMI 37.62 kg/m2 NAD. Alert, oriented. Lungs clear. Heart regular rate rhythm. Abdomen soft nondistended nontender. Significant central obesity with large waist circumference noted. Lower extremities no edema. See diabetic foot exam. Results for orders placed in visit on 08/13/13  POCT GLYCOSYLATED HEMOGLOBIN (HGB A1C)      Result Value Ref Range   Hemoglobin A1C 5.4      Assessment: Problem List Items Addressed This Visit     Digestive   NAFLD (nonalcoholic fatty liver disease) - Primary   Relevant Orders      Hepatic function panel     Endocrine   Type 2 diabetes mellitus   Relevant Medications      metFORMIN (GLUCOPHAGE) tablet      losartan (COZAAR) tablet      Albiglutide (TANZEUM) 50 MG PEN   Other Relevant Orders      POCT glycosylated hemoglobin (Hb A1C) (Completed)      Microalbumin, urine     Other   Obesity   Hyperlipidemia   Relevant Orders      Lipid panel   Anxiety and depression     Plan: Meds ordered this encounter  Medications  . ranitidine (ZANTAC) 300 MG tablet    Sig: Take 1 tablet (300 mg total) by mouth at bedtime. Prn acid reflux    Dispense:  90 tablet    Refill:  1    Order Specific Question:  Supervising Provider    Answer:  Mikey Kirschner [[id number]]  . metFORMIN (GLUCOPHAGE) 500 MG tablet    Sig: Take 1 tablet (500 mg total) by mouth 2 (two) times daily with a meal.    Dispense:  180 tablet    Refill:  1    Order Specific  Question:  Supervising Provider    Answer:  Mikey Kirschner [[id number]]  . losartan (COZAAR) 50 MG tablet    Sig: Take 1 tablet (50 mg total) by mouth daily.    Dispense:  90 tablet    Refill:  1    Order Specific Question:  Supervising Provider    Answer:  Mikey Kirschner [[id number]]  . diclofenac (VOLTAREN) 75 MG EC tablet    Sig: Take 1 tablet (75 mg total) by mouth 2 (two) times daily. Prn pain    Dispense:  60 tablet    Refill:  5    Order Specific Question:  Supervising Provider    Answer:  Mikey Kirschner [[id number]]  . citalopram (CELEXA) 40 MG tablet    Sig: Take 1 tablet (40 mg total) by mouth daily.    Dispense:  90 tablet    Refill:  1    Order Specific Question:  Supervising Provider    Answer:  Mikey Kirschner [[id number]]  . DISCONTD: hydrochlorothiazide (HYDRODIURIL) 25 MG tablet    Sig: Take 1 tablet (25 mg total) by mouth daily.    Dispense:  90 tablet    Refill:  1    Order Specific  Question:  Supervising Provider    Answer:  Mikey Kirschner [[id number]]  . Albiglutide (TANZEUM) 50 MG PEN    Sig: Inject subcu once a week for diabetes    Dispense:  4 each    Refill:  5    Patient has voucher for free medication    Order Specific Question:  Supervising Provider    Answer:  Mikey Kirschner [[id number]]   start with Tanzeum 30 mg x1 for the first week. Patient given instructions about injection by the nurse. Discussed importance of weight loss, healthy diet and regular activity. Strongly recommended patient consider bariatric surgery. .Return in about 3 months (around 11/13/2013). Call back sooner if any problems.

## 2013-08-17 NOTE — Telephone Encounter (Addendum)
[place] HCTZ was D/C.  Fenofibrate is not on med list. Ask patient did she mean to request a different med.

## 2013-08-18 ENCOUNTER — Other Ambulatory Visit: Payer: Self-pay | Admitting: Nurse Practitioner

## 2013-08-18 MED ORDER — HYDROCHLOROTHIAZIDE 25 MG PO TABS: 25.0000 mg | ORAL_TABLET | Freq: Every day | ORAL | Status: DC

## 2013-08-18 MED ORDER — FENOFIBRATE 160 MG PO TABS: 160.0000 mg | ORAL_TABLET | Freq: Every day | ORAL | Status: DC

## 2013-08-18 NOTE — Telephone Encounter (Signed)
Both meds have been sent in.

## 2013-09-11 ENCOUNTER — Encounter: Payer: Self-pay | Admitting: Family Medicine

## 2013-09-11 ENCOUNTER — Ambulatory Visit (INDEPENDENT_AMBULATORY_CARE_PROVIDER_SITE_OTHER): Payer: BC Managed Care – PPO | Admitting: Family Medicine

## 2013-09-11 ENCOUNTER — Telehealth: Payer: Self-pay | Admitting: Family Medicine

## 2013-09-11 VITALS — BP 138/86 | Temp 98.7°F | Ht 64.0 in | Wt 220.8 lb

## 2013-09-11 DIAGNOSIS — J029 Acute pharyngitis, unspecified: Secondary | ICD-10-CM

## 2013-09-11 DIAGNOSIS — R590 Localized enlarged lymph nodes: Secondary | ICD-10-CM

## 2013-09-11 DIAGNOSIS — R599 Enlarged lymph nodes, unspecified: Secondary | ICD-10-CM

## 2013-09-11 MED ORDER — HYDROCODONE-ACETAMINOPHEN 5-325 MG PO TABS: 1.0000 | ORAL_TABLET | Freq: Four times a day (QID) | ORAL | Status: DC | PRN

## 2013-09-11 MED ORDER — DOXYCYCLINE HYCLATE 100 MG PO CAPS: 100.0000 mg | ORAL_CAPSULE | Freq: Two times a day (BID) | ORAL | Status: DC

## 2013-09-11 NOTE — Telephone Encounter (Signed)
error 

## 2013-09-11 NOTE — Progress Notes (Signed)
   Subjective:    Patient ID: Isabella Juarez, female    DOB: Oct 21, 1965, 48 y.o.   MRN: [id number]  Sore Throat  This is a new problem. The current episode started in the past 7 days. Associated symptoms include congestion and coughing. Treatments tried: antibiotics.   Patient stated she had Omnicef 300 mg at home and has been taking antibiotic for 3 days but it has no helped.   Review of Systems  HENT: Positive for congestion.   Respiratory: Positive for cough.    PMH benign    Objective:   Physical Exam  Throat minimal erythema neck is supple with tender anterior lymph nodes eardrums normal sinus nontender      Assessment & Plan:  Mild sore throat with coughing this could be a viral process versus early sinus infection patient really would like to have antibiotics to help her out I told her that it's possible the antibiotics may not help. We went ahead with doxycycline twice a day for 10 days take with a snack in the fall glass of water. If ongoing troubles may need testing

## 2013-09-18 ENCOUNTER — Telehealth: Payer: Self-pay | Admitting: Family Medicine

## 2013-09-18 NOTE — Telephone Encounter (Signed)
Patient wants to know if Isabella Juarez will change her phentermine back to qsymia. She is not happy with results of phentermine.  [facility]

## 2013-09-21 ENCOUNTER — Other Ambulatory Visit: Payer: Self-pay | Admitting: Nurse Practitioner

## 2013-09-21 MED ORDER — PHENTERMINE-TOPIRAMATE ER 11.25-69 MG PO CP24: ORAL_CAPSULE | ORAL | Status: DC

## 2013-09-21 NOTE — Telephone Encounter (Signed)
Yes. Does she want to start back on 11.25/69 dose?

## 2013-09-21 NOTE — Telephone Encounter (Signed)
Med sent to pharm pt notified on voicemail

## 2013-09-21 NOTE — Telephone Encounter (Signed)
Yes, patient would like to start back on that dose. Please send to [facility]. Told patient we would fax it by the end of the day.

## 2013-09-21 NOTE — Telephone Encounter (Signed)
Order sent in. Patient will need to be seen in 3 months before any more refills.

## 2013-09-22 ENCOUNTER — Telehealth: Payer: Self-pay | Admitting: Family Medicine

## 2013-09-22 NOTE — Telephone Encounter (Signed)
Called - [employer] is faxing me a form to complete for prior auth for pt's [facility]

## 2013-09-23 NOTE — Telephone Encounter (Signed)
Form received, forwarded to [facility] for help completing

## 2013-09-25 NOTE — Telephone Encounter (Signed)
Pt called to check on status of prior auth for Qysmia, explained that I forwarded form to [facility] to be complete, once it's complete I will fax in, pt asked how long it would take and if Hoyle Sauer was here today, explained that she's only provider in today and will work on messages in between pt's as she can

## 2013-09-25 NOTE — Telephone Encounter (Signed)
Paper for PA for Qsymia is done. Thanks.

## 2013-09-29 NOTE — Telephone Encounter (Signed)
Faxed completed form for pt's Qysmia 09/28/13, await decision

## 2013-10-01 NOTE — Telephone Encounter (Signed)
Rx prior auth APPROVED for QSYMIA 11.25/69 mg capsules, expires 03/28/14 through [employer], faxed approval to [facility]

## 2013-10-10 ENCOUNTER — Other Ambulatory Visit: Payer: Self-pay | Admitting: Family Medicine

## 2013-11-09 ENCOUNTER — Ambulatory Visit (INDEPENDENT_AMBULATORY_CARE_PROVIDER_SITE_OTHER): Payer: BC Managed Care – PPO | Admitting: Nurse Practitioner

## 2013-11-09 ENCOUNTER — Encounter: Payer: Self-pay | Admitting: Nurse Practitioner

## 2013-11-09 VITALS — BP 134/90 | Ht 64.0 in | Wt 216.0 lb

## 2013-11-09 DIAGNOSIS — R5381 Other malaise: Secondary | ICD-10-CM

## 2013-11-09 DIAGNOSIS — R5382 Chronic fatigue, unspecified: Secondary | ICD-10-CM

## 2013-11-09 DIAGNOSIS — R5383 Other fatigue: Secondary | ICD-10-CM

## 2013-11-09 DIAGNOSIS — R0609 Other forms of dyspnea: Secondary | ICD-10-CM

## 2013-11-09 DIAGNOSIS — F32A Depression, unspecified: Secondary | ICD-10-CM

## 2013-11-09 DIAGNOSIS — K219 Gastro-esophageal reflux disease without esophagitis: Secondary | ICD-10-CM

## 2013-11-09 DIAGNOSIS — F329 Major depressive disorder, single episode, unspecified: Secondary | ICD-10-CM

## 2013-11-09 DIAGNOSIS — R0989 Other specified symptoms and signs involving the circulatory and respiratory systems: Secondary | ICD-10-CM

## 2013-11-09 DIAGNOSIS — R0683 Snoring: Secondary | ICD-10-CM

## 2013-11-09 DIAGNOSIS — F419 Anxiety disorder, unspecified: Secondary | ICD-10-CM

## 2013-11-09 DIAGNOSIS — K760 Fatty (change of) liver, not elsewhere classified: Secondary | ICD-10-CM

## 2013-11-09 DIAGNOSIS — E669 Obesity, unspecified: Secondary | ICD-10-CM

## 2013-11-09 DIAGNOSIS — F341 Dysthymic disorder: Secondary | ICD-10-CM

## 2013-11-09 DIAGNOSIS — K7689 Other specified diseases of liver: Secondary | ICD-10-CM

## 2013-11-09 DIAGNOSIS — E119 Type 2 diabetes mellitus without complications: Secondary | ICD-10-CM

## 2013-11-09 DIAGNOSIS — I10 Essential (primary) hypertension: Secondary | ICD-10-CM

## 2013-11-09 MED ORDER — FENOFIBRATE 160 MG PO TABS: 160.0000 mg | ORAL_TABLET | Freq: Every day | ORAL | Status: DC

## 2013-11-09 MED ORDER — LOSARTAN POTASSIUM 50 MG PO TABS: 50.0000 mg | ORAL_TABLET | Freq: Every day | ORAL | Status: DC

## 2013-11-09 MED ORDER — PHENTERMINE-TOPIRAMATE ER 15-92 MG PO CP24: ORAL_CAPSULE | ORAL | Status: DC

## 2013-11-09 MED ORDER — METFORMIN HCL 500 MG PO TABS: 500.0000 mg | ORAL_TABLET | Freq: Two times a day (BID) | ORAL | Status: DC

## 2013-11-09 MED ORDER — LIRAGLUTIDE 18 MG/3ML ~~LOC~~ SOPN: PEN_INJECTOR | SUBCUTANEOUS | Status: DC

## 2013-11-09 MED ORDER — HYDROCHLOROTHIAZIDE 25 MG PO TABS: 25.0000 mg | ORAL_TABLET | Freq: Every day | ORAL | Status: DC

## 2013-11-09 MED ORDER — CITALOPRAM HYDROBROMIDE 40 MG PO TABS: 40.0000 mg | ORAL_TABLET | Freq: Every day | ORAL | Status: DC

## 2013-11-09 NOTE — Progress Notes (Signed)
Subjective:  Presents for routine followup. Uses tobramycin eyedrops off-and-on about every 3 months for one to 2 days for the past 3 years. Was given this by her optometrist for "pink eye". Only involves the right eye. Reflux is stable, rare use of Zantac, once a week at most. Has rejoined [employer]. Has a daily walking program. Would like to increase her dose of Qsymia. No patient has not had her fluid pill this morning. No chest pain/ischemic type pain or shortness of breath. No edema. Hasn't orthopedic issues in her feet, otherwise no signs of neuropathy. Regular eye exams. Trial of Tanzeum, patient had adverse effects. Would like to switch back to her Victoza. Depression and anxiety stable, doing well on Celexa. Complaints of significant daily chronic fatigue although patient gets 8 hours of rest per night with no awakenings. Does have very loud snoring according to her family. High scores on [place] questionnaire-see scanned sheet.  Objective:   BP 134/90  Ht 5\' 4"  (1.626 m)  Wt 216 lb (97.977 kg)  BMI 37.06 kg/m2 NAD. Alert, oriented. Thyroid normal limit to palpation and nontender. Lungs clear. Heart regular rate rhythm. Abdomen soft nondistended nontender. See diabetic foot exam.  Assessment:  Problem List Items Addressed This Visit     Cardiovascular and Mediastinum   Essential hypertension, benign   Relevant Medications      losartan (COZAAR) tablet      hydrochlorothiazide tablet      fenofibrate tablet     Digestive   NAFLD (nonalcoholic fatty liver disease)     Endocrine   Type 2 diabetes mellitus - Primary   Relevant Medications      Liraglutide (VICTOZA) 18 MG/3ML SOPN      metFORMIN (GLUCOPHAGE) tablet      losartan (COZAAR) tablet   Other Relevant Orders      Hemoglobin A1c      Basic metabolic panel     Other   Obesity   Relevant Medications      Liraglutide (VICTOZA) 18 MG/3ML SOPN      metFORMIN (GLUCOPHAGE) tablet      Phentermine-Topiramate 15-92 MG  CP24   Anxiety and depression    Other Visit Diagnoses   Gastroesophageal reflux disease without esophagitis        Chronic fatigue        Relevant Orders       TSH    Snoring          Plan: Meds ordered this encounter  Medications  . DISCONTD: Phentermine-Topiramate 11.25-69 MG CP24    Sig: Take by mouth daily.  . Liraglutide (VICTOZA) 18 MG/3ML SOPN    Sig: INJECT 1.8 MG SUBQ DAILY    Dispense:  9 pen    Refill:  1    Order Specific Question:  Supervising Provider    Answer:  Mikey Kirschner [[id number]]  . metFORMIN (GLUCOPHAGE) 500 MG tablet    Sig: Take 1 tablet (500 mg total) by mouth 2 (two) times daily with a meal.    Dispense:  180 tablet    Refill:  1    Order Specific Question:  Supervising Provider    Answer:  Mikey Kirschner [[id number]]  . losartan (COZAAR) 50 MG tablet    Sig: Take 1 tablet (50 mg total) by mouth daily.    Dispense:  90 tablet    Refill:  1    Order Specific Question:  Supervising Provider    Answer:  Mikey Kirschner [[id number]]  .  hydrochlorothiazide (HYDRODIURIL) 25 MG tablet    Sig: Take 1 tablet (25 mg total) by mouth daily.    Dispense:  90 tablet    Refill:  1    Order Specific Question:  Supervising Provider    Answer:  Mikey Kirschner [[id number]]  . fenofibrate 160 MG tablet    Sig: Take 1 tablet (160 mg total) by mouth daily. For cholesterol    Dispense:  90 tablet    Refill:  1    Order Specific Question:  Supervising Provider    Answer:  Mikey Kirschner [[id number]]  . citalopram (CELEXA) 40 MG tablet    Sig: Take 1 tablet (40 mg total) by mouth daily.    Dispense:  90 tablet    Refill:  1    Order Specific Question:  Supervising Provider    Answer:  Mikey Kirschner [[id number]]  . Phentermine-Topiramate 15-92 MG CP24    Sig: One po qd for weight    Dispense:  30 capsule    Refill:  2    Order Specific Question:  Supervising Provider    Answer:  Mikey Kirschner [[id number]]   schedule some form of sleep study to evaluate for OSA. Encouraged  continued weight loss and activity. Return in about 3 months (around 02/09/2014). Call back sooner if any problems.

## 2013-11-16 ENCOUNTER — Telehealth: Payer: Self-pay | Admitting: Family Medicine

## 2013-11-16 NOTE — Telephone Encounter (Signed)
Pt's QYSMIA changed strengths, called [employer] spoke with Crystal, she adjusted case # [id number] to reflect new strength of 15/92mg , faxed adjustment to [facility], auth will expire 03/28/14

## 2014-02-11 ENCOUNTER — Ambulatory Visit: Payer: BC Managed Care – PPO | Admitting: Nurse Practitioner

## 2014-02-12 ENCOUNTER — Ambulatory Visit (INDEPENDENT_AMBULATORY_CARE_PROVIDER_SITE_OTHER): Payer: BC Managed Care – PPO | Admitting: Nurse Practitioner

## 2014-02-12 ENCOUNTER — Encounter: Payer: Self-pay | Admitting: Nurse Practitioner

## 2014-02-12 VITALS — BP 130/90 | Ht 64.0 in | Wt 214.0 lb

## 2014-02-12 DIAGNOSIS — E119 Type 2 diabetes mellitus without complications: Secondary | ICD-10-CM

## 2014-02-12 DIAGNOSIS — K219 Gastro-esophageal reflux disease without esophagitis: Secondary | ICD-10-CM

## 2014-02-12 DIAGNOSIS — E669 Obesity, unspecified: Secondary | ICD-10-CM

## 2014-02-12 DIAGNOSIS — I10 Essential (primary) hypertension: Secondary | ICD-10-CM

## 2014-02-12 MED ORDER — PHENTERMINE HCL 37.5 MG PO TABS: 37.5000 mg | ORAL_TABLET | Freq: Every day | ORAL | Status: DC

## 2014-02-12 MED ORDER — RANITIDINE HCL 300 MG PO TABS: 300.0000 mg | ORAL_TABLET | Freq: Every day | ORAL | Status: DC

## 2014-02-12 MED ORDER — LIRAGLUTIDE 18 MG/3ML ~~LOC~~ SOPN: PEN_INJECTOR | SUBCUTANEOUS | Status: DC

## 2014-02-13 LAB — BASIC METABOLIC PANEL
BUN: 11 mg/dL (ref 6–23)
CALCIUM: 9 mg/dL (ref 8.4–10.5)
CO2: 27 mEq/L (ref 19–32)
CREATININE: 0.97 mg/dL (ref 0.50–1.10)
Chloride: 105 mEq/L (ref 96–112)
GLUCOSE: 141 mg/dL — AB (ref 70–99)
Potassium: 3.5 mEq/L (ref 3.5–5.3)
SODIUM: 139 meq/L (ref 135–145)

## 2014-02-13 LAB — HEMOGLOBIN A1C
Hgb A1c MFr Bld: 5.7 % — ABNORMAL HIGH (ref <5.7)
Mean Plasma Glucose: 117 mg/dL — ABNORMAL HIGH (ref <117)

## 2014-02-13 LAB — TSH: TSH: 1.74 u[IU]/mL (ref 0.350–4.500)

## 2014-02-14 ENCOUNTER — Encounter: Payer: Self-pay | Admitting: Nurse Practitioner

## 2014-02-14 DIAGNOSIS — K219 Gastro-esophageal reflux disease without esophagitis: Secondary | ICD-10-CM | POA: Insufficient documentation

## 2014-02-14 NOTE — Progress Notes (Signed)
Subjective:  Presents for routine followup. Would like to stop Qsymia, has not lost a significant amount weight lately, also very expensive. Would like to restart phentermine which she has taken in the past without difficulty. Also requesting refill of her Victoza. Reflux well controlled with Zantac. No chest pain/ischemic type pain shortness of breath or edema. Receives her flu vaccine at work.  Objective:   BP 130/90  Ht 5\' 4"  (1.626 m)  Wt 214 lb (97.07 kg)  BMI 36.72 kg/m2 NAD. Alert, oriented. Lungs clear. Heart regular rate rhythm. Abdomen soft nondistended nontender. Significant central obesity noted.  Assessment:  Problem List Items Addressed This Visit     Cardiovascular and Mediastinum   Essential hypertension, benign     Digestive   Gastroesophageal reflux disease without esophagitis   Relevant Medications      ranitidine (ZANTAC) tablet     Endocrine   Type 2 diabetes mellitus - Primary   Relevant Medications      Liraglutide (VICTOZA) 18 MG/3ML SOPN     Other   Obesity   Relevant Medications      Liraglutide (VICTOZA) 18 MG/3ML SOPN      phentermine (ADIPEX-P) 37.5 MG tablet       Plan:  Meds ordered this encounter  Medications  . Liraglutide (VICTOZA) 18 MG/3ML SOPN    Sig: INJECT 1.8 MG SUBQ DAILY    Dispense:  3 pen    Refill:  5    Order Specific Question:  Supervising Provider    Answer:  Mikey Kirschner [[id number]]  . ranitidine (ZANTAC) 300 MG tablet    Sig: Take 1 tablet (300 mg total) by mouth at bedtime. Prn acid reflux    Dispense:  90 tablet    Refill:  1    Order Specific Question:  Supervising Provider    Answer:  Mikey Kirschner [[id number]]  . phentermine (ADIPEX-P) 37.5 MG tablet    Sig: Take 1 tablet (37.5 mg total) by mouth daily before breakfast.    Dispense:  30 tablet    Refill:  2    Order Specific Question:  Supervising Provider    Answer:  Mikey Kirschner [[id number]]   Encouraged continued healthy diet, regular exercise and weight  loss. Also encouraged patient to get lab work that was previously ordered. Return in about 3 months (around 05/14/2014).

## 2014-03-10 ENCOUNTER — Other Ambulatory Visit: Payer: Self-pay | Admitting: Family Medicine

## 2014-04-03 ENCOUNTER — Other Ambulatory Visit: Payer: Self-pay | Admitting: Family Medicine

## 2014-05-11 ENCOUNTER — Ambulatory Visit: Payer: BC Managed Care – PPO | Admitting: Family Medicine

## 2014-07-07 LAB — HM DIABETES EYE EXAM

## 2014-07-14 ENCOUNTER — Encounter: Payer: Self-pay | Admitting: *Deleted

## 2014-07-15 ENCOUNTER — Encounter: Payer: Self-pay | Admitting: Nurse Practitioner

## 2014-07-15 ENCOUNTER — Ambulatory Visit (INDEPENDENT_AMBULATORY_CARE_PROVIDER_SITE_OTHER): Payer: BC Managed Care – PPO | Admitting: Nurse Practitioner

## 2014-07-15 VITALS — BP 126/84 | Ht 64.0 in | Wt 219.8 lb

## 2014-07-15 DIAGNOSIS — E119 Type 2 diabetes mellitus without complications: Secondary | ICD-10-CM | POA: Diagnosis not present

## 2014-07-15 DIAGNOSIS — F329 Major depressive disorder, single episode, unspecified: Secondary | ICD-10-CM

## 2014-07-15 DIAGNOSIS — M778 Other enthesopathies, not elsewhere classified: Secondary | ICD-10-CM

## 2014-07-15 DIAGNOSIS — E785 Hyperlipidemia, unspecified: Secondary | ICD-10-CM

## 2014-07-15 DIAGNOSIS — I10 Essential (primary) hypertension: Secondary | ICD-10-CM

## 2014-07-15 DIAGNOSIS — F419 Anxiety disorder, unspecified: Secondary | ICD-10-CM

## 2014-07-15 DIAGNOSIS — F32A Depression, unspecified: Secondary | ICD-10-CM

## 2014-07-15 DIAGNOSIS — K760 Fatty (change of) liver, not elsewhere classified: Secondary | ICD-10-CM

## 2014-07-15 DIAGNOSIS — F418 Other specified anxiety disorders: Secondary | ICD-10-CM | POA: Diagnosis not present

## 2014-07-15 MED ORDER — LIRAGLUTIDE 18 MG/3ML ~~LOC~~ SOPN: PEN_INJECTOR | SUBCUTANEOUS | Status: DC

## 2014-07-15 MED ORDER — LOSARTAN POTASSIUM 50 MG PO TABS: 50.0000 mg | ORAL_TABLET | Freq: Every day | ORAL | Status: DC

## 2014-07-15 MED ORDER — METFORMIN HCL 500 MG PO TABS: 500.0000 mg | ORAL_TABLET | Freq: Two times a day (BID) | ORAL | Status: DC

## 2014-07-15 MED ORDER — DICLOFENAC SODIUM 75 MG PO TBEC: 75.0000 mg | DELAYED_RELEASE_TABLET | Freq: Two times a day (BID) | ORAL | Status: DC

## 2014-07-15 MED ORDER — CITALOPRAM HYDROBROMIDE 40 MG PO TABS: 40.0000 mg | ORAL_TABLET | Freq: Every day | ORAL | Status: DC

## 2014-07-15 MED ORDER — FENOFIBRATE 160 MG PO TABS: 160.0000 mg | ORAL_TABLET | Freq: Every day | ORAL | Status: DC

## 2014-07-15 MED ORDER — HYDROCHLOROTHIAZIDE 25 MG PO TABS: 25.0000 mg | ORAL_TABLET | Freq: Every day | ORAL | Status: DC

## 2014-07-15 MED ORDER — PHENTERMINE-TOPIRAMATE ER 7.5-46 MG PO CP24: ORAL_CAPSULE | ORAL | Status: DC

## 2014-07-15 MED ORDER — RANITIDINE HCL 300 MG PO TABS
300.0000 mg | ORAL_TABLET | Freq: Every day | ORAL | Status: DC
Start: 2014-07-15 — End: 2019-01-28

## 2014-07-15 MED ORDER — PHENTERMINE-TOPIRAMATE ER 11.25-69 MG PO CP24: ORAL_CAPSULE | ORAL | Status: DC

## 2014-07-15 NOTE — Patient Instructions (Signed)
Qsymia Belviq Contrave 

## 2014-07-19 ENCOUNTER — Encounter: Payer: Self-pay | Admitting: Nurse Practitioner

## 2014-07-19 NOTE — Progress Notes (Signed)
Subjective:  Presents for routine follow up. Gets regular physicals with GYN including mammograms. Will get labs through work this spring and bring copy to next visit. No CP/ischemic type pain or SOB. Wants to restart Qsymia; has done well on this in the past. Also c/o bilat wrist pain especially right hand which is dominant. Does computer work. No specific history of injury. Numbness when she wakes up at night. No neck, shoulder or elbow pain. Has not done well with diet. Limited exercise. celexa working well at current dose.   Objective:   BP 126/84 mmHg  Ht 5\' 4"  (1.626 m)  Wt 219 lb 12.8 oz (99.701 kg)  BMI 37.71 kg/m2 NAD. Alert, oriented. Lungs clear. Heart RRR. Right wrist mild edema with tenderness. No erythema or warmth. Passive ROM of wrist with tenderness. phalen and tinel's produces numbness in most of the fingers.  Assessment:  Problem List Items Addressed This Visit      Cardiovascular and Mediastinum   Essential hypertension, benign - Primary   Relevant Medications   fenofibrate tablet   hydrochlorothiazide tablet   losartan (COZAAR) tablet     Digestive   NAFLD (nonalcoholic fatty liver disease)     Endocrine   Type 2 diabetes mellitus   Relevant Medications   Liraglutide (VICTOZA) 18 MG/3ML SOPN   losartan (COZAAR) tablet   metFORMIN (GLUCOPHAGE) tablet     Other   Hyperlipidemia   Relevant Medications   fenofibrate tablet   hydrochlorothiazide tablet   losartan (COZAAR) tablet   Anxiety and depression   Morbid obesity   Relevant Medications   Liraglutide (VICTOZA) 18 MG/3ML SOPN   metFORMIN (GLUCOPHAGE) tablet   Phentermine-Topiramate 7.5-46 MG CP24   Phentermine-Topiramate 11.25-69 MG CP24    Other Visit Diagnoses    Tendinitis of both wrists          Plan:  Meds ordered this encounter  Medications  . citalopram (CELEXA) 40 MG tablet    Sig: Take 1 tablet (40 mg total) by mouth daily.    Dispense:  90 tablet    Refill:  1    Order Specific  Question:  Supervising Provider    Answer:  Mikey Kirschner [[id number]]  . diclofenac (VOLTAREN) 75 MG EC tablet    Sig: Take 1 tablet (75 mg total) by mouth 2 (two) times daily. Prn pain    Dispense:  60 tablet    Refill:  5    Order Specific Question:  Supervising Provider    Answer:  Mikey Kirschner [[id number]]  . fenofibrate 160 MG tablet    Sig: Take 1 tablet (160 mg total) by mouth daily. For cholesterol    Dispense:  90 tablet    Refill:  1    Order Specific Question:  Supervising Provider    Answer:  Mikey Kirschner [[id number]]  . hydrochlorothiazide (HYDRODIURIL) 25 MG tablet    Sig: Take 1 tablet (25 mg total) by mouth daily.    Dispense:  90 tablet    Refill:  1    Order Specific Question:  Supervising Provider    Answer:  Mikey Kirschner [[id number]]  . Liraglutide (VICTOZA) 18 MG/3ML SOPN    Sig: INJECT 1.8 MG SUBQ DAILY    Dispense:  3 pen    Refill:  5    Order Specific Question:  Supervising Provider    Answer:  Mikey Kirschner [[id number]]  . losartan (COZAAR) 50 MG tablet    Sig: Take 1 tablet (  50 mg total) by mouth daily.    Dispense:  90 tablet    Refill:  1    Order Specific Question:  Supervising Provider    Answer:  Mikey Kirschner [[id number]]  . metFORMIN (GLUCOPHAGE) 500 MG tablet    Sig: Take 1 tablet (500 mg total) by mouth 2 (two) times daily with a meal.    Dispense:  180 tablet    Refill:  1    Order Specific Question:  Supervising Provider    Answer:  Mikey Kirschner [[id number]]  . ranitidine (ZANTAC) 300 MG tablet    Sig: Take 1 tablet (300 mg total) by mouth at bedtime. Prn acid reflux    Dispense:  90 tablet    Refill:  1    Order Specific Question:  Supervising Provider    Answer:  Mikey Kirschner [[id number]]  . Phentermine-Topiramate 7.5-46 MG CP24    Sig: One po qd    Dispense:  30 capsule    Refill:  0    Order Specific Question:  Supervising Provider    Answer:  Mikey Kirschner [[id number]]  . Phentermine-Topiramate 11.25-69 MG CP24    Sig: One po qd     Dispense:  30 capsule    Refill:  2    Order Specific Question:  Supervising Provider    Answer:  Mikey Kirschner [[id number]]   Diclofenac, wrist brace and ice to right wrist. Call back if persists.  Encouraged healthy diet, exercise and weight loss. Bring labs to next visit. Given a note to get urine microalbumin and HgbA1C with labs. Return in about 3 months (around 10/13/2014) for recheck.

## 2014-07-28 ENCOUNTER — Telehealth: Payer: Self-pay | Admitting: Family Medicine

## 2014-07-28 NOTE — Telephone Encounter (Signed)
Rx prior auth APPROVED for Phentermine-Topiramate 7.5-46 MG CP24, expires 01/19/15, faxed approval to [facility]

## 2014-09-28 ENCOUNTER — Other Ambulatory Visit: Payer: Self-pay | Admitting: Obstetrics and Gynecology

## 2014-09-29 LAB — CYTOLOGY - PAP

## 2014-10-04 ENCOUNTER — Other Ambulatory Visit: Payer: Self-pay | Admitting: Obstetrics and Gynecology

## 2014-10-04 DIAGNOSIS — R928 Other abnormal and inconclusive findings on diagnostic imaging of breast: Secondary | ICD-10-CM

## 2014-10-13 ENCOUNTER — Ambulatory Visit: Payer: BC Managed Care – PPO | Admitting: Nurse Practitioner

## 2014-10-14 ENCOUNTER — Encounter: Payer: Self-pay | Admitting: Nurse Practitioner

## 2014-10-14 ENCOUNTER — Ambulatory Visit (INDEPENDENT_AMBULATORY_CARE_PROVIDER_SITE_OTHER): Payer: BC Managed Care – PPO | Admitting: Nurse Practitioner

## 2014-10-14 VITALS — BP 134/86 | Ht 64.0 in | Wt 211.1 lb

## 2014-10-14 DIAGNOSIS — E785 Hyperlipidemia, unspecified: Secondary | ICD-10-CM

## 2014-10-14 DIAGNOSIS — F418 Other specified anxiety disorders: Secondary | ICD-10-CM | POA: Diagnosis not present

## 2014-10-14 DIAGNOSIS — K760 Fatty (change of) liver, not elsewhere classified: Secondary | ICD-10-CM

## 2014-10-14 DIAGNOSIS — F32A Depression, unspecified: Secondary | ICD-10-CM

## 2014-10-14 DIAGNOSIS — E119 Type 2 diabetes mellitus without complications: Secondary | ICD-10-CM

## 2014-10-14 DIAGNOSIS — F329 Major depressive disorder, single episode, unspecified: Secondary | ICD-10-CM

## 2014-10-14 DIAGNOSIS — F419 Anxiety disorder, unspecified: Secondary | ICD-10-CM

## 2014-10-14 MED ORDER — PHENTERMINE-TOPIRAMATE ER 15-92 MG PO CP24: ORAL_CAPSULE | ORAL | Status: DC

## 2014-10-17 ENCOUNTER — Encounter: Payer: Self-pay | Admitting: Nurse Practitioner

## 2014-10-17 NOTE — Progress Notes (Signed)
Subjective:  Presents for routine follow up. Has been off fenofibrate; did not stop for any particular reason; denies any adverse effects. Has been working on weight loss. Regular activity. No CP/ischemic type pain or SOB. Anxiety and depression stable.   Objective:   BP 134/86 mmHg  Ht 5\' 4"  (1.626 m)  Wt 211 lb 2 oz (95.766 kg)  BMI 36.22 kg/m2 NAD. Alert, oriented. Lungs clear. Heart RRR. Lower extremities no edema. Has lost almost 10 lbs since last visit. Labs from work dated 07/21/14 shows LDL 155. See scanned lab report.   Assessment:  Problem List Items Addressed This Visit      Digestive   NAFLD (nonalcoholic fatty liver disease) - Primary   Relevant Orders   Hepatic function panel     Endocrine   Type 2 diabetes mellitus   Relevant Orders   Microalbumin, urine     Other   Anxiety and depression   Hyperlipidemia   Relevant Orders   Lipid panel   Morbid obesity   Relevant Medications   Phentermine-Topiramate 15-92 MG CP24     Plan: discussed importance of getting LDL down due to fatty liver disease and diabetes. Agrees to restart fenofibrate. Continue to work on weight loss. Increase dosage of Qysmia.  Return in about 3 months (around 01/14/2015). Recommend repeat labs including urine microalbumin at that time.

## 2014-11-04 ENCOUNTER — Ambulatory Visit
Admission: RE | Admit: 2014-11-04 | Discharge: 2014-11-04 | Disposition: A | Discharge status: Home / Self Care | Payer: BC Managed Care – PPO | Source: Ambulatory Visit | Attending: Obstetrics and Gynecology | Admitting: Obstetrics and Gynecology

## 2014-11-04 DIAGNOSIS — R928 Other abnormal and inconclusive findings on diagnostic imaging of breast: Secondary | ICD-10-CM

## 2015-01-04 ENCOUNTER — Ambulatory Visit (INDEPENDENT_AMBULATORY_CARE_PROVIDER_SITE_OTHER): Payer: BC Managed Care – PPO | Admitting: Family Medicine

## 2015-01-04 ENCOUNTER — Encounter: Payer: Self-pay | Admitting: Family Medicine

## 2015-01-04 VITALS — BP 152/92 | Ht 64.0 in | Wt 226.5 lb

## 2015-01-04 DIAGNOSIS — J189 Pneumonia, unspecified organism: Secondary | ICD-10-CM

## 2015-01-04 MED ORDER — CLARITHROMYCIN 500 MG PO TABS: ORAL_TABLET | ORAL | Status: DC

## 2015-01-04 MED ORDER — FLUCONAZOLE 150 MG PO TABS: ORAL_TABLET | ORAL | Status: DC

## 2015-01-04 MED ORDER — HYDROCODONE-HOMATROPINE 5-1.5 MG/5ML PO SYRP: ORAL_SOLUTION | ORAL | Status: DC

## 2015-01-04 MED ORDER — ALBUTEROL SULFATE HFA 108 (90 BASE) MCG/ACT IN AERS: 2.0000 | INHALATION_SPRAY | Freq: Four times a day (QID) | RESPIRATORY_TRACT | Status: DC | PRN

## 2015-01-04 NOTE — Progress Notes (Signed)
   Subjective:    Patient ID: Isabella Juarez, female    DOB: 01-Jul-1965, 49 y.o.   MRN: [id number]  Cough This is a new problem. The current episode started in the past 7 days. The problem has been gradually worsening. The problem occurs every few minutes. The cough is non-productive. Associated symptoms include chest pain, a fever, headaches and wheezing. Associated symptoms comments: Diarrhea. She has tried OTC cough suppressant (Mucinex AM and PM, Cough Syrup, cough drops) for the symptoms. The treatment provided no relief.   Wed not feeling well, started as ac cough,  Then took mucinex am and pm  Pos fever  Diminished energy . Glu doing well   Pos hx of yeast infxn   frontl  Patient also has c/o of a yeast infection.   Review of Systems  Constitutional: Positive for fever.  Respiratory: Positive for cough and wheezing.   Cardiovascular: Positive for chest pain.  Neurological: Positive for headaches.       Objective:   Physical Exam  Alert mild malaise vital stable HEENT moderate his congestion lungs bilateral mild wheezes positive right lower inspiratory crackles      Assessment & Plan:  Impression post viral early community acquired pneumonia with reactive airways plan albuterol 2 sprays 4 times a day proper use discussed antibiotics prescribed symptom care discussed WSL

## 2015-01-18 ENCOUNTER — Ambulatory Visit: Payer: BC Managed Care – PPO | Admitting: Nurse Practitioner

## 2015-01-19 ENCOUNTER — Telehealth: Payer: Self-pay | Admitting: Family Medicine

## 2015-01-19 MED ORDER — HYDROCODONE-HOMATROPINE 5-1.5 MG/5ML PO SYRP: ORAL_SOLUTION | ORAL | Status: DC

## 2015-01-19 MED ORDER — CLARITHROMYCIN 500 MG PO TABS: ORAL_TABLET | ORAL | Status: DC

## 2015-01-19 NOTE — Telephone Encounter (Signed)
Rx for antibiotic sent in electronically to pharmacy. rx for cough med up front for patient pick up. Patient notified.

## 2015-01-19 NOTE — Telephone Encounter (Signed)
Ok ref cough med and abx keep using inhaler

## 2015-01-19 NOTE — Telephone Encounter (Signed)
Pt called stating that she is not any better cough wise pt states that she feels better but also feels like she felt when she first started getting sick. Pt is needing a refill on her cough meds and possibly her antibiotics.   [place]

## 2015-01-24 ENCOUNTER — Other Ambulatory Visit: Payer: Self-pay | Admitting: Nurse Practitioner

## 2015-02-02 ENCOUNTER — Encounter: Payer: Self-pay | Admitting: Family Medicine

## 2015-02-02 ENCOUNTER — Telehealth: Payer: Self-pay | Admitting: Family Medicine

## 2015-02-02 NOTE — Telephone Encounter (Signed)
Rx prior auth DENIED for pt's QSYMIA 15-92 MG CP24 , faxed denial letter to [facility], mailed letter to pt - needs office visit

## 2015-06-25 ENCOUNTER — Other Ambulatory Visit: Payer: Self-pay | Admitting: Nurse Practitioner

## 2015-08-23 ENCOUNTER — Telehealth: Payer: Self-pay | Admitting: Family Medicine

## 2015-08-23 NOTE — Telephone Encounter (Signed)
ERROR

## 2018-07-11 ENCOUNTER — Encounter (INDEPENDENT_AMBULATORY_CARE_PROVIDER_SITE_OTHER): Payer: Self-pay

## 2018-07-28 ENCOUNTER — Encounter (INDEPENDENT_AMBULATORY_CARE_PROVIDER_SITE_OTHER): Payer: Self-pay | Admitting: Internal Medicine

## 2018-08-14 ENCOUNTER — Encounter (INDEPENDENT_AMBULATORY_CARE_PROVIDER_SITE_OTHER): Payer: Self-pay | Admitting: Internal Medicine

## 2018-08-22 ENCOUNTER — Ambulatory Visit (INDEPENDENT_AMBULATORY_CARE_PROVIDER_SITE_OTHER): Payer: BC Managed Care – PPO | Admitting: Internal Medicine

## 2018-11-14 ENCOUNTER — Telehealth: Payer: Self-pay | Admitting: *Deleted

## 2018-11-14 ENCOUNTER — Other Ambulatory Visit: Payer: Self-pay | Admitting: Internal Medicine

## 2018-11-14 ENCOUNTER — Other Ambulatory Visit: Payer: BC Managed Care – PPO

## 2018-11-14 DIAGNOSIS — Z20822 Contact with and (suspected) exposure to covid-19: Secondary | ICD-10-CM

## 2018-11-14 NOTE — Telephone Encounter (Signed)
Possible exposure, referred by Dr. Hurshel Party at [place].[phone] appointment scheduled for this morning at 11:am at the [place] site. Informed to wear mask and stay in vehicle.

## 2018-11-20 LAB — NOVEL CORONAVIRUS, NAA: SARS-CoV-2, NAA: NOT DETECTED

## 2019-01-20 ENCOUNTER — Encounter (INDEPENDENT_AMBULATORY_CARE_PROVIDER_SITE_OTHER): Payer: BC Managed Care – PPO | Admitting: Internal Medicine

## 2019-01-28 ENCOUNTER — Ambulatory Visit (INDEPENDENT_AMBULATORY_CARE_PROVIDER_SITE_OTHER): Payer: BC Managed Care – PPO | Admitting: Internal Medicine

## 2019-01-28 ENCOUNTER — Encounter (INDEPENDENT_AMBULATORY_CARE_PROVIDER_SITE_OTHER): Payer: Self-pay | Admitting: Internal Medicine

## 2019-01-28 ENCOUNTER — Other Ambulatory Visit: Payer: Self-pay

## 2019-01-28 VITALS — BP 146/88 | HR 72 | Ht 64.0 in | Wt 215.4 lb

## 2019-01-28 DIAGNOSIS — Z0000 Encounter for general adult medical examination without abnormal findings: Secondary | ICD-10-CM

## 2019-01-28 DIAGNOSIS — Z0001 Encounter for general adult medical examination with abnormal findings: Secondary | ICD-10-CM | POA: Diagnosis not present

## 2019-01-28 DIAGNOSIS — E1169 Type 2 diabetes mellitus with other specified complication: Secondary | ICD-10-CM | POA: Diagnosis not present

## 2019-01-28 DIAGNOSIS — K760 Fatty (change of) liver, not elsewhere classified: Secondary | ICD-10-CM

## 2019-01-28 DIAGNOSIS — E2839 Other primary ovarian failure: Secondary | ICD-10-CM

## 2019-01-28 DIAGNOSIS — I10 Essential (primary) hypertension: Secondary | ICD-10-CM | POA: Diagnosis not present

## 2019-01-28 DIAGNOSIS — E559 Vitamin D deficiency, unspecified: Secondary | ICD-10-CM

## 2019-01-28 DIAGNOSIS — E782 Mixed hyperlipidemia: Secondary | ICD-10-CM | POA: Diagnosis not present

## 2019-01-28 NOTE — Patient Instructions (Signed)
[facility] Dietary Recommendations for Weight Loss What to Avoid . Avoid added sugars o Often added sugar can be found in processed foods such as many condiments, dry cereals, cakes, cookies, chips, crisps, crackers, candies, sweetened drinks, etc.  o Read labels and AVOID/DECREASE use of foods with the following in their ingredient list: Sugar, fructose, high fructose corn syrup, sucrose, glucose, maltose, dextrose, molasses, cane sugar, brown sugar, any type of syrup, agave nectar, etc.   . Avoid snacking in between meals . Avoid foods made with flour o If you are going to eat food made with flour, choose those made with whole-grains; and, minimize your consumption as much as is tolerable . Avoid processed foods o These foods are generally stocked in the middle of the grocery store. Focus on shopping on the perimeter of the grocery.  What to Include . Vegetables o GREEN LEAFY VEGETABLES: Kale, spinach, mustard greens, collard greens, cabbage, broccoli, etc. o OTHER: Asparagus, cauliflower, eggplant, carrots, peas, Brussel sprouts, tomatoes, bell peppers, zucchini, beets, cucumbers, etc. . Grains, seeds, and legumes o Beans: kidney beans, black eyed peas, garbanzo beans, black beans, pinto beans, etc. o Whole, unrefined grains: brown rice, barley, bulgur, oatmeal, etc. . Healthy fats  o Avoid highly processed fats such as vegetable oil o Examples of healthy fats: avocado, olives, virgin olive oil, dark chocolate (?72% Cocoa), nuts (peanuts, almonds, walnuts, cashews, pecans, etc.) . Low - Moderate Intake of Animal Sources of Protein o Meat sources: chicken, turkey, salmon, tuna. Limit to 4 ounces of meat at one time. o Consider limiting dairy sources, but when choosing dairy focus on: PLAIN Greek yogurt, cottage cheese, high-protein milk . Fruit o Choose berries  When to Eat . Intermittent Fasting: o Choosing not to eat for a specific time period, but DO FOCUS ON HYDRATION  when fasting o Multiple Techniques: - Time Restricted Eating: eat 3 meals in a day, each meal lasting no more than 60 minutes, no snacks between meals - 16-18 hour fast: fast for 16 to 18 hours up to 7 days a week. Often suggested to start with 2-3 nonconsecutive days per week.  . Remember the time you sleep is counted as fasting.  . Examples of eating schedule: Fast from 7:00pm-11:00am. Eat between 11:00am-7:00pm.  - 24-hour fast: fast for 24 hours up to every other day. Often suggested to start with 1 day per week . Remember the time you sleep is counted as fasting . Examples of eating schedule:  o Eating day: eat 2-3 meals on your eating day. If doing 2 meals, each meal should last no more than 90 minutes. If doing 3 meals, each meal should last no more than 60 minutes. Finish last meal by 7:00pm. o Fasting day: Fast until 7:00pm.  o IF YOU FEEL UNWELL FOR ANY REASON/IN ANY WAY WHEN FASTING, STOP FASTING BY EATING A NUTRITIOUS SNACK OR LIGHT MEAL o ALWAYS FOCUS ON HYDRATION DURING FASTS - Acceptable Hydration sources: water, broths, tea/coffee (black tea/coffee is best but using a small amount of whole-fat dairy products in coffee/tea is acceptable).  - Poor Hydration Sources: anything with sugar or artificial sweeteners added to it  These recommendations have been developed for patients that are actively receiving medical care from either Dr. Shamar Kracke or Sarah Gray, DNP, NP-C at [facility]. These recommendations are developed for patients with specific medical conditions and are not meant to be distributed or used by others that are not actively receiving care from either provider listed   above at [facility]. It is not appropriate to participate in the above eating plans without proper medical supervision.   Reference: Fung, J. The obesity code. [place]: [place]; 2016.   

## 2019-01-28 NOTE — Addendum Note (Signed)
Addended by: Doree Albee on: 01/28/2019 02:50 PM   Modules accepted: Orders

## 2019-01-28 NOTE — Progress Notes (Signed)
Chief Complaint: This very pleasant 53 year old lady comes in for an annual physical exam and also to address her chronic conditions which are described below. HPI: She has a history of PCOS and we have been working hard to try and reduce insulin resistance.  Comorbid complications of this include diabetes, hypertension, hyperlipidemia.  She continues to take metformin and Victoza for her diabetes.  She has been for the most part doing intermittent fasting 14 to 16 hours every day and now is on a whole food plant-based diet.  She has virtually given up eating protein from animals.  Overall, she does feel better.  However, her weight is static and she is not losing further weight at the present time. She did see an eye doctor about a year ago and she is due for a visit now. She denies any paresthesia of her feet or hands. She is due to have a mammogram.  She will get this with her OB/GYN. She continues on antihypertensive medications.  She denies any chest pain, dyspnea or palpitations.  Thankfully, she has no history of coronary artery disease or cerebrovascular disease at the present time. She also has gastroesophageal reflux disease and takes Protonix only as needed nowadays. She continues with statin therapy for hyperlipidemia in the face of diabetes.  She denies any myalgias. She also continues with bioidentical hormone therapy as she has had early menopause from bilateral oophorectomy when she had her hysterectomy done.  She is tolerating estradiol, progesterone and testosterone therapy.  She has no side effects from these.  Past Medical History:  Diagnosis Date  . Allergic rhinitis   . Anxiety   . Axillary mass 08/2012   left  . Dental crowns present   . Diabetes mellitus    NIDDM  . Diverticulosis of sigmoid colon 08/18/2012   and descending colon  . GERD (gastroesophageal reflux disease)    seldom  . Hemorrhoids   . High triglycerides   . Hypertension    under control with meds.,  has been on med. x 5-6 yr.  . Interstitial cystitis    has had no problems in 25 yr.  . NASH (nonalcoholic steatohepatitis)   . PCOS (polycystic ovarian syndrome)    Past Surgical History:  Procedure Laterality Date  . ABDOMINAL HYSTERECTOMY    . CESAREAN SECTION  08/02/1987, 12/15/1990  . CHOLECYSTECTOMY  01/05/2005   lap. chole.  Marland Kitchen COLONOSCOPY W/ POLYPECTOMY  08/18/2012  . LIVER BIOPSY  01/05/2005  . MASS EXCISION Left 09/04/2012   Procedure: Excision Left Axillary Mass 10 cm;  Surgeon: Haywood Lasso, MD;  Location: [place];  Service: General;  Laterality: Left;  . TOTAL ABDOMINAL HYSTERECTOMY W/ BILATERAL SALPINGOOPHORECTOMY  03/16/2009        Family History  Problem Relation Age of Onset  . Hypertension Mother   . Cancer Maternal Grandmother        breast    Social History:  reports that she has never smoked. She has never used smokeless tobacco. She reports that she does not drink alcohol or use drugs.   Allergies:  Allergies  Allergen Reactions  . Dyazide [Hydrochlorothiazide W-Triamterene]     Fatigue   . Penicillins   . Tanzeum [Albiglutide] Other (See Comments)    Pharyngitis; burning in the mouth  . Vasotec [Enalapril] Cough       [id number] from the symptoms mentioned above,there are no other symptoms referable to all systems reviewed.  Physical Exam: Blood pressure (!) 146/88,  pulse 72, height 5\' 4"  (1.626 m), weight 215 lb 6.4 oz (97.7 kg). Vitals with BMI 01/28/2019 06/18/2018 04/10/2018  Height 5\' 4"  5\' 4"  -  Weight 215 lbs 6 oz 212 lbs 6 oz 229 lbs 10 oz  BMI [id number] -  Systolic [id number]  Diastolic 88 96 83  Pulse 72 90 101      She looks systemically well, remains morbidly obese. General: Alert, cooperative, and appears to be the stated age.No pallor.  No jaundice.  No clubbing. Head: Normocephalic Eyes: Sclera white, pupils equal and reactive to light, red reflex x 2,  Ears: Normal bilaterally Oral cavity: Lips,  mucosa, and tongue normal: Teeth and gums normal Neck: No adenopathy, supple, symmetrical, trachea midline, and thyroid does not appear enlarged Breast: No masses felt. Respiratory: Clear to auscultation bilaterally.No wheezing, crackles or bronchial breathing. Cardiovascular: Heart sounds are present and appear to be normal without murmurs or added sounds.  No carotid bruits.  Peripheral pulses are present and equal bilaterally.: Soft, nontender, Gastrointestinal:positive bowel sounds, no hepatosplenomegaly.  No masses felt.No tenderness. Skin: Clear, No rashes noted.No worrisome skin lesions seen. Neurological: Grossly intact without focal findings, cranial nerves II through XII intact, muscle strength equal bilaterally.  Foot examination did not show any evidence of peripheral neuropathy.  I used a microfilament for this testing. Musculoskeletal: No acute joint abnormalities noted.Full range of movement noted with joints. Psychiatric: Affect appropriate, non-anxious.    Assessment  1. Essential hypertension, benign   2. NAFLD (nonalcoholic fatty liver disease)   3. Type 2 diabetes mellitus with other specified complication, without long-term current use of insulin ([place])   4. Mixed hyperlipidemia   5. Morbid obesity ([place])   6. General medical exam   7. 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 Vitamin D deficiency disease       Plan  1. She will continue with all medications for chronic conditions described above. 2. Blood work was done as outlined below. 3. I discussed with her nutrition again at length and we discussed the importance of intermittent fasting and I think she will have to go for longer hours to achieve the results she wants.  I told her that she can start 18 hours every day and progress from there.  Eventually she might be doing 24 hours fasting every other day or even longer than this.  I stressed the importance of hydration and salt intake during fasting.  She will continue  to eat a whole food plant-based diet in addition to this.  In terms of exercise, we discussed what she is already doing which is walking but also addition of strength training. 4. I recommended that she get influenza vaccination in October when we will be getting this out. 5. Further recommendations will depend on blood results and I will see her in 3 months time for follow-up. 6. Today, in addition to a preventative visit, I performed an office visit to address her chronic conditions above.      Tests Ordered:   Orders Placed This Encounter  Procedures  . CBC  . COMPLETE METABOLIC PANEL WITH GFR  . Estradiol  . Hemoglobin A1c  . Lipid panel  . Progesterone  . T3, free  . TSH  . Testosterone, Free, Total, SHBG  . VITAMIN D 25 Hydroxy (Vit-D Deficiency, Fractures)     Macio Kissoon C Daliana Leverett   01/28/2019, 12:05 PM

## 2019-01-29 ENCOUNTER — Other Ambulatory Visit (INDEPENDENT_AMBULATORY_CARE_PROVIDER_SITE_OTHER): Payer: Self-pay | Admitting: Internal Medicine

## 2019-01-29 LAB — COMPLETE METABOLIC PANEL WITH GFR
AG Ratio: 1.5 (calc) (ref 1.0–2.5)
ALT: 34 U/L — ABNORMAL HIGH (ref 6–29)
AST: 24 U/L (ref 10–35)
Albumin: 4 g/dL (ref 3.6–5.1)
Alkaline phosphatase (APISO): 72 U/L (ref 37–153)
BUN: 9 mg/dL (ref 7–25)
CO2: 25 mmol/L (ref 20–32)
Calcium: 9.4 mg/dL (ref 8.6–10.4)
Chloride: 102 mmol/L (ref 98–110)
Creat: 0.61 mg/dL (ref 0.50–1.05)
GFR, Est African American: 119 mL/min/{1.73_m2} (ref >=60)
GFR, Est Non African American: 103 mL/min/{1.73_m2} (ref >=60)
Globulin: 2.6 g/dL (calc) (ref 1.9–3.7)
Glucose, Bld: 97 mg/dL (ref 65–99)
Potassium: 3.9 mmol/L (ref 3.5–5.3)
Sodium: 139 mmol/L (ref 135–146)
Total Bilirubin: 0.7 mg/dL (ref 0.2–1.2)
Total Protein: 6.6 g/dL (ref 6.1–8.1)

## 2019-01-29 LAB — CBC
HCT: 41.7 % (ref 35.0–45.0)
Hemoglobin: 14.4 g/dL (ref 11.7–15.5)
MCH: 28.8 pg (ref 27.0–33.0)
MCHC: 34.5 g/dL (ref 32.0–36.0)
MCV: 83.4 fL (ref 80.0–100.0)
MPV: 10.2 fL (ref 7.5–12.5)
Platelets: 289 10*3/uL (ref 140–400)
RBC: 5 10*6/uL (ref 3.80–5.10)
RDW: 12.8 % (ref 11.0–15.0)
WBC: 7.4 10*3/uL (ref 3.8–10.8)

## 2019-01-29 LAB — LIPID PANEL
Cholesterol: 122 mg/dL (ref <200)
HDL: 38 mg/dL — ABNORMAL LOW (ref >=50)
LDL Cholesterol (Calc): 62 mg/dL (calc)
Non-HDL Cholesterol (Calc): 84 mg/dL (calc) (ref <130)
Total CHOL/HDL Ratio: 3.2 (calc) (ref <5.0)
Triglycerides: 135 mg/dL (ref <150)

## 2019-01-29 LAB — VITAMIN D 25 HYDROXY (VIT D DEFICIENCY, FRACTURES): Vit D, 25-Hydroxy: 54 ng/mL (ref 30–100)

## 2019-01-29 LAB — HEMOGLOBIN A1C
Hgb A1c MFr Bld: 6.3 % of total Hgb — ABNORMAL HIGH (ref <5.7)
Mean Plasma Glucose: 134 (calc)
eAG (mmol/L): 7.4 (calc)

## 2019-01-29 LAB — T3, FREE: T3, Free: 4.4 pg/mL — ABNORMAL HIGH (ref 2.3–4.2)

## 2019-01-29 LAB — ESTRADIOL: Estradiol: 40 pg/mL

## 2019-01-29 LAB — PROGESTERONE: Progesterone: 20.5 ng/mL

## 2019-01-29 LAB — TSH: TSH: 0.24 mIU/L — ABNORMAL LOW

## 2019-01-29 MED ORDER — ESTRADIOL 2 MG PO TABS: 4.0000 mg | ORAL_TABLET | Freq: Every day | ORAL | 3 refills | Status: DC

## 2019-01-31 LAB — TESTOS,TOTAL,FREE AND SHBG (FEMALE)
Free Testosterone: 1.2 pg/mL (ref 0.1–6.4)
Sex Hormone Binding: 60 nmol/L (ref 17–124)
Testosterone, Total, LC-MS-MS: 13 ng/dL (ref 2–45)

## 2019-02-18 ENCOUNTER — Other Ambulatory Visit (INDEPENDENT_AMBULATORY_CARE_PROVIDER_SITE_OTHER): Payer: Self-pay | Admitting: Internal Medicine

## 2019-02-19 LAB — HM DIABETES EYE EXAM

## 2019-03-05 ENCOUNTER — Other Ambulatory Visit: Payer: Self-pay

## 2019-03-05 DIAGNOSIS — Z20822 Contact with and (suspected) exposure to covid-19: Secondary | ICD-10-CM

## 2019-03-06 LAB — NOVEL CORONAVIRUS, NAA: SARS-CoV-2, NAA: DETECTED — AB

## 2019-03-10 ENCOUNTER — Telehealth (INDEPENDENT_AMBULATORY_CARE_PROVIDER_SITE_OTHER): Payer: Self-pay

## 2019-03-10 NOTE — Telephone Encounter (Signed)
Pt called asking what could they do or take to make them feel better at this time for the COVID.

## 2019-03-12 ENCOUNTER — Encounter (INDEPENDENT_AMBULATORY_CARE_PROVIDER_SITE_OTHER): Payer: Self-pay | Admitting: Internal Medicine

## 2019-03-12 ENCOUNTER — Other Ambulatory Visit (INDEPENDENT_AMBULATORY_CARE_PROVIDER_SITE_OTHER): Payer: Self-pay | Admitting: Internal Medicine

## 2019-03-12 MED ORDER — PREDNISONE 20 MG PO TABS: 40.0000 mg | ORAL_TABLET | Freq: Every day | ORAL | 1 refills | Status: DC

## 2019-03-12 NOTE — Telephone Encounter (Signed)
Called pt and told her once she is covid free and symptom free she can take then.

## 2019-03-12 NOTE — Telephone Encounter (Signed)
Foot pain again; asking for prednisone.

## 2019-04-30 ENCOUNTER — Ambulatory Visit (INDEPENDENT_AMBULATORY_CARE_PROVIDER_SITE_OTHER): Payer: BC Managed Care – PPO | Admitting: Internal Medicine

## 2019-05-07 ENCOUNTER — Other Ambulatory Visit: Payer: Self-pay

## 2019-05-07 ENCOUNTER — Ambulatory Visit (INDEPENDENT_AMBULATORY_CARE_PROVIDER_SITE_OTHER): Payer: BC Managed Care – PPO | Admitting: Internal Medicine

## 2019-05-07 ENCOUNTER — Encounter (INDEPENDENT_AMBULATORY_CARE_PROVIDER_SITE_OTHER): Payer: Self-pay | Admitting: Internal Medicine

## 2019-05-07 VITALS — BP 129/79 | HR 96 | Temp 97.6°F | Ht 64.0 in | Wt 231.4 lb

## 2019-05-07 DIAGNOSIS — L02219 Cutaneous abscess of trunk, unspecified: Secondary | ICD-10-CM

## 2019-05-07 DIAGNOSIS — E1169 Type 2 diabetes mellitus with other specified complication: Secondary | ICD-10-CM | POA: Diagnosis not present

## 2019-05-07 DIAGNOSIS — E2839 Other primary ovarian failure: Secondary | ICD-10-CM | POA: Diagnosis not present

## 2019-05-07 DIAGNOSIS — Z8619 Personal history of other infectious and parasitic diseases: Secondary | ICD-10-CM

## 2019-05-07 DIAGNOSIS — U071 COVID-19: Secondary | ICD-10-CM

## 2019-05-07 DIAGNOSIS — Z6839 Body mass index (BMI) 39.0-39.9, adult: Secondary | ICD-10-CM

## 2019-05-07 MED ORDER — NP THYROID 120 MG PO TABS: 120.0000 mg | ORAL_TABLET | Freq: Every day | ORAL | 0 refills | Status: DC

## 2019-05-07 MED ORDER — TOBRAMYCIN 0.3 % OP SOLN: 1.0000 [drp] | OPHTHALMIC | 6 refills | Status: DC

## 2019-05-07 MED ORDER — AMOXICILLIN-POT CLAVULANATE 875-125 MG PO TABS: 1.0000 | ORAL_TABLET | Freq: Two times a day (BID) | ORAL | 0 refills | Status: DC

## 2019-05-07 MED ORDER — ESTRADIOL 2 MG PO TABS: 4.0000 mg | ORAL_TABLET | Freq: Every day | ORAL | 0 refills | Status: DC

## 2019-05-07 MED ORDER — GUAIFENESIN-CODEINE 100-10 MG/5ML PO SOLN: 10.0000 mL | Freq: Three times a day (TID) | ORAL | 0 refills | Status: DC | PRN

## 2019-05-07 NOTE — Progress Notes (Signed)
Metrics: Intervention Frequency ACO  Documented Smoking Status Yearly  Screened one or more times in 24 months  Cessation Counseling or  Active cessation medication Past 24 months  Past 24 months   Guideline developer: UpToDate (See UpToDate for funding source) Date Released: 2014       Wellness Office Visit  Subjective:  Patient ID: [facility], female    DOB: 1966-02-06  Age: 53 y.o. MRN: [id number]  CC: This lady comes in for follow-up of diabetes, obesity, hypertension, PCOS and fatty liver disease. HPI  Unfortunately she contracted COVID-19 disease approximately 2 months ago but has survived the infection but is now left with fatigue, chronic cough which is sometimes productive.  She did not go to the hospital during this time she quarantined appropriately. Because she had loss of taste, unfortunately, she ate many things which did not satisfy her in ended up eating unhealthy foods and has gained weight.  Since Thanksgiving, she has begun to do intermittent fasting again and has lost some weight since this time. She has tolerated higher dose of estradiol that we increase last time. She wants to try testosterone cream instead of injectable testosterone. She continues on progesterone therapy also. She is also complaining of skin boils which are on her trunk and buttock area.   As far as her diabetes is concerned, she continues on Metformin, Victoza. Past Medical History:  Diagnosis Date  . Allergic rhinitis   . Anxiety   . Axillary mass 08/2012   left  . Dental crowns present   . Diabetes mellitus    NIDDM  . Diverticulosis of sigmoid colon 08/18/2012   and descending colon  . GERD (gastroesophageal reflux disease)    seldom  . Hemorrhoids   . High triglycerides   . Hypertension    under control with meds., has been on med. x 5-6 yr.  . Interstitial cystitis    has had no problems in 25 yr.  . NASH (nonalcoholic steatohepatitis)   . PCOS (polycystic ovarian syndrome)        Family History  Problem Relation Age of Onset  . Hypertension Mother   . Cancer Maternal Grandmother        breast    Social History   Social History Narrative   Daily caffeine    Married for 64yr.   Social History   Tobacco Use  . Smoking status: Never Smoker  . Smokeless tobacco: Never Used  Substance Use Topics  . Alcohol use: No    Current Meds  Medication Sig  . atorvastatin (LIPITOR) 80 MG tablet Take 80 mg by mouth daily.  . Cholecalciferol (VITAMIN D-3) 125 MCG (5000 UT) TABS Take 10,000 Units by mouth daily at 12 noon.  . citalopram (CELEXA) 40 MG tablet Take 1 tablet (40 mg total) by mouth daily.  .Marland Kitchenestradiol (ESTRACE) 2 MG tablet Take 2 tablets (4 mg total) by mouth daily.  . hydrochlorothiazide (HYDRODIURIL) 25 MG tablet Take 1 tablet (25 mg total) by mouth daily.  .Marland Kitchenliraglutide (VICTOZA) 18 MG/3ML SOPN INJECT 1.8 MG SUBCUTANEOUSLY ONCE DAILY  . losartan (COZAAR) 50 MG tablet TAKE ONE TABLET BY MOUTH ONCE DAILY  . Melatonin 1 MG CAPS Take 1 capsule by mouth at bedtime.  . metFORMIN (GLUCOPHAGE) 500 MG tablet Take 1,000 mg by mouth 2 (two) times daily with a meal.  . NP THYROID 120 MG tablet Take 1 tablet (120 mg total) by mouth daily before breakfast.  . pantoprazole (PROTONIX) 40 MG  tablet Take 40 mg by mouth daily.  . potassium chloride SA (K-DUR) 20 MEQ tablet Take 40 mEq by mouth daily.  . predniSONE (DELTASONE) 20 MG tablet Take 2 tablets (40 mg total) by mouth daily with breakfast.  . progesterone (PROMETRIUM) 200 MG capsule Take 400 mg by mouth daily.  . TESTOSTERONE CYPIONATE IM Inject 0.3 mLs into the muscle 2 (two) times a week. 23m/ml concentration,compounded  . vitamin B-12 (CYANOCOBALAMIN) 250 MCG tablet Take 250 mcg by mouth daily.  . vitamin C (ASCORBIC ACID) 500 MG tablet Take 500 mg by mouth daily.  .Marland Kitchenzinc gluconate 50 MG tablet Take 50 mg by mouth daily.  . [DISCONTINUED] estradiol (ESTRACE) 2 MG tablet Take 2 tablets (4 mg total) by  mouth daily.  . [DISCONTINUED] NP THYROID 120 MG tablet Take 120 mg by mouth daily before breakfast.     Nutrition  Not consistent but she has been doing intermittent fasting and eating a plant-based diet since Thanksgiving again. Sleep  Variable.  Exercise  None regular. Bio Identical Hormones  Testosterone therapy is being used off label for symptoms of testosterone deficiency and benefits that it produces based on several studies.  These benefits include decreasing body fat, increasing in lean muscle mass and increasing in bone density.  There is improvement of memory, cognition.  There is improvement in exercise tolerance and endurance.  Testosterone therapy has also been shown to be protective against coronary artery disease, cerebrovascular disease, diabetes, hypertension and degenerative joint disease. I have discussed with the patient the FDA warnings regarding testosterone therapy, benefits and side effects and modes of administration as well as monitoring blood levels and side effects  on a regular basis The patient is agreeable that testosterone therapy should be an integral part of his/her wellness,quality of life and prevention of chronic disease.  This patient is being treated with desiccated thyroid, off label, for symptoms of thyroid deficiency.  The patient has been counseled regarding side effects and how to deal with them.  Micronized progesterone is being used in this patient for multiple benefits based on studies including protection against uterine cancer, breast cancer, osteoporosis and heart disease. The patient has been counseled regarding side effects, benefits and modes of administration. The patient is agreeable that this therapy is an integral part of her wellness, quality of life and prevention of chronic disease.  Estradiol is being used in this patient for multiple benefits based on several studies including protection against heart disease, cerebrovascular  disease, osteoporosis, colon cancer, Alzheimer's disease, macular degeneration and cataracts. The patient has been counseled regarding benefits and side effects and modes of administration. The patient is agreeable that this therapy is an integral to part of her wellness, quality of life and prevention of chronic disease.  Objective:   Today's Vitals: BP 129/79 (BP Location: Right Arm, Patient Position: Sitting, Cuff Size: Normal)   Pulse 96   Temp 97.6 F (36.4 C) (Oral)   Ht '5\' 4"'  (1.626 m)   Wt 231 lb 6.4 oz (105 kg)   SpO2 97%   BMI 39.72 kg/m  Vitals with BMI 05/07/2019 01/28/2019 06/18/2018  Height '5\' 4"'  '5\' 4"'  '5\' 4"'   Weight 231 lbs 6 oz 215 lbs 6 oz 212 lbs 6 oz  BMI 39.7 [phone] Systolic [id number] Diastolic 79 88 96  Pulse 96 72 90     Physical Exam    She looks systemically well, remains morbidly obese and has gained 16 pounds  since the last time I saw her.  Blood pressure is reasonable.  She is alert and orientated without any focal neurological signs.  Examination of the trunk shows what looks like an abscess with surrounding cellulitis on her left breast and there are 7 lesions apparently on her buttock that looks similar.   Assessment   1. Female hypogonadism syndrome   2. Type 2 diabetes mellitus with other specified complication, without long-term current use of insulin ([place])   3. Morbid obesity ([place])   4. COVID-19   5. Cutaneous abscess of trunk, unspecified site of trunk       Tests ordered Orders Placed This Encounter  Procedures  . Estradiol  . Progesterone  . Hemoglobin A1c  . CMP with eGFR([facility])     Plan: 1. She will continue with bioidentical hormone therapy and we will check levels today. 2. She will continue with diabetic medications and we will check A1c today. 3. She will try to focus again on nutrition that we have discussed before. 4. I am going to try her on Robitussin with codeine for symptomatic relief of her cough from  COVID-19 disease. 5. I am going to treat her with Augmentin for her abscesses on her skin, possibly representing MRSA. 6. Further recommendations will depend on blood results and I will see her in 3 months time for follow-up   Meds ordered this encounter  Medications  . guaiFENesin-codeine 100-10 MG/5ML syrup    Sig: Take 10 mLs by mouth 3 (three) times daily as needed for cough.    Dispense:  236 mL    Refill:  0  . NP THYROID 120 MG tablet    Sig: Take 1 tablet (120 mg total) by mouth daily before breakfast.    Dispense:  90 tablet    Refill:  0  . estradiol (ESTRACE) 2 MG tablet    Sig: Take 2 tablets (4 mg total) by mouth daily.    Dispense:  180 tablet    Refill:  0    This is a new increased dose.  . tobramycin (TOBREX) 0.3 % ophthalmic solution    Sig: Place 1 drop into both eyes every 4 (four) hours.    Dispense:  5 mL    Refill:  6  . amoxicillin-clavulanate (AUGMENTIN) 875-125 MG tablet    Sig: Take 1 tablet by mouth 2 (two) times daily.    Dispense:  20 tablet    Refill:  0    Ailana Cuadrado Luther Parody, MD

## 2019-05-08 LAB — HEMOGLOBIN A1C
Hgb A1c MFr Bld: 8.8 % of total Hgb — ABNORMAL HIGH (ref <5.7)
Mean Plasma Glucose: 206 (calc)
eAG (mmol/L): 11.4 (calc)

## 2019-05-08 LAB — COMPLETE METABOLIC PANEL WITH GFR
AG Ratio: 1.7 (calc) (ref 1.0–2.5)
ALT: 62 U/L — ABNORMAL HIGH (ref 6–29)
AST: 58 U/L — ABNORMAL HIGH (ref 10–35)
Albumin: 4.1 g/dL (ref 3.6–5.1)
Alkaline phosphatase (APISO): 85 U/L (ref 37–153)
BUN: 9 mg/dL (ref 7–25)
CO2: 28 mmol/L (ref 20–32)
Calcium: 8.9 mg/dL (ref 8.6–10.4)
Chloride: 98 mmol/L (ref 98–110)
Creat: 0.62 mg/dL (ref 0.50–1.05)
GFR, Est African American: 118 mL/min/{1.73_m2} (ref >=60)
GFR, Est Non African American: 102 mL/min/{1.73_m2} (ref >=60)
Globulin: 2.4 g/dL (calc) (ref 1.9–3.7)
Glucose, Bld: 210 mg/dL — ABNORMAL HIGH (ref 65–99)
Potassium: 3.4 mmol/L — ABNORMAL LOW (ref 3.5–5.3)
Sodium: 138 mmol/L (ref 135–146)
Total Bilirubin: 0.4 mg/dL (ref 0.2–1.2)
Total Protein: 6.5 g/dL (ref 6.1–8.1)

## 2019-05-08 LAB — ESTRADIOL: Estradiol: 68 pg/mL

## 2019-05-08 LAB — PROGESTERONE: Progesterone: 0.6 ng/mL

## 2019-05-13 ENCOUNTER — Other Ambulatory Visit (INDEPENDENT_AMBULATORY_CARE_PROVIDER_SITE_OTHER): Payer: Self-pay | Admitting: Internal Medicine

## 2019-05-13 ENCOUNTER — Encounter (INDEPENDENT_AMBULATORY_CARE_PROVIDER_SITE_OTHER): Payer: Self-pay | Admitting: Internal Medicine

## 2019-05-13 MED ORDER — LOSARTAN POTASSIUM 50 MG PO TABS: 50.0000 mg | ORAL_TABLET | Freq: Every day | ORAL | 0 refills | Status: DC

## 2019-05-13 NOTE — Progress Notes (Signed)
Patient called.  Pt notified and ask to go into mychart to work and see results &  ask any concerns.

## 2019-05-23 ENCOUNTER — Other Ambulatory Visit (INDEPENDENT_AMBULATORY_CARE_PROVIDER_SITE_OTHER): Payer: Self-pay | Admitting: Internal Medicine

## 2019-05-30 ENCOUNTER — Other Ambulatory Visit (INDEPENDENT_AMBULATORY_CARE_PROVIDER_SITE_OTHER): Payer: Self-pay | Admitting: Internal Medicine

## 2019-06-03 ENCOUNTER — Other Ambulatory Visit (INDEPENDENT_AMBULATORY_CARE_PROVIDER_SITE_OTHER): Payer: Self-pay | Admitting: Internal Medicine

## 2019-07-10 ENCOUNTER — Encounter (INDEPENDENT_AMBULATORY_CARE_PROVIDER_SITE_OTHER): Payer: Self-pay | Admitting: Internal Medicine

## 2019-07-13 NOTE — Telephone Encounter (Signed)
Do you want to see her telemed then send for imaging?

## 2019-07-23 ENCOUNTER — Encounter (INDEPENDENT_AMBULATORY_CARE_PROVIDER_SITE_OTHER): Payer: Self-pay | Admitting: Internal Medicine

## 2019-07-23 ENCOUNTER — Other Ambulatory Visit (INDEPENDENT_AMBULATORY_CARE_PROVIDER_SITE_OTHER): Payer: Self-pay | Admitting: Internal Medicine

## 2019-07-23 NOTE — Telephone Encounter (Signed)
MED REQUEST

## 2019-07-24 ENCOUNTER — Other Ambulatory Visit (INDEPENDENT_AMBULATORY_CARE_PROVIDER_SITE_OTHER): Payer: Self-pay | Admitting: Internal Medicine

## 2019-08-05 ENCOUNTER — Other Ambulatory Visit: Payer: Self-pay

## 2019-08-05 ENCOUNTER — Encounter (INDEPENDENT_AMBULATORY_CARE_PROVIDER_SITE_OTHER): Payer: Self-pay | Admitting: Internal Medicine

## 2019-08-05 ENCOUNTER — Ambulatory Visit (INDEPENDENT_AMBULATORY_CARE_PROVIDER_SITE_OTHER): Payer: BC Managed Care – PPO | Admitting: Internal Medicine

## 2019-08-05 VITALS — BP 144/88 | HR 90 | Temp 98.4°F | Resp 18 | Ht 64.0 in | Wt 229.0 lb

## 2019-08-05 DIAGNOSIS — E782 Mixed hyperlipidemia: Secondary | ICD-10-CM

## 2019-08-05 DIAGNOSIS — I10 Essential (primary) hypertension: Secondary | ICD-10-CM | POA: Diagnosis not present

## 2019-08-05 DIAGNOSIS — E559 Vitamin D deficiency, unspecified: Secondary | ICD-10-CM

## 2019-08-05 DIAGNOSIS — E2839 Other primary ovarian failure: Secondary | ICD-10-CM | POA: Diagnosis not present

## 2019-08-05 DIAGNOSIS — E1169 Type 2 diabetes mellitus with other specified complication: Secondary | ICD-10-CM

## 2019-08-05 MED ORDER — TRULICITY 3 MG/0.5ML ~~LOC~~ SOAJ: 3.0000 mg | SUBCUTANEOUS | 3 refills | Status: DC

## 2019-08-05 MED ORDER — HYDROCHLOROTHIAZIDE 25 MG PO TABS: 25.0000 mg | ORAL_TABLET | Freq: Every day | ORAL | 1 refills | Status: DC

## 2019-08-05 MED ORDER — FLUCONAZOLE 150 MG PO TABS: 150.0000 mg | ORAL_TABLET | Freq: Every day | ORAL | 0 refills | Status: DC

## 2019-08-05 NOTE — Progress Notes (Signed)
Metrics: Intervention Frequency ACO  Documented Smoking Status Yearly  Screened one or more times in 24 months  Cessation Counseling or  Active cessation medication Past 24 months  Past 24 months   Guideline developer: UpToDate (See UpToDate for funding source) Date Released: 2014       Wellness Office Visit  Subjective:  Patient ID: [facility], female    DOB: 03/24/1966  Age: 54 y.o. MRN: [id number]  CC: This lady comes in for follow-up of diabetes, hypertension, obesity, PCOS. HPI  Unfortunately, her blood glucose levels have been elevated.  She has been on several courses of steroids recently.  She also has a vaginal yeast infection that she needs Diflucan for. She continues on bioidentical hormone therapy and is tolerating this well.  She is now on testosterone cream. Past Medical History:  Diagnosis Date  . Allergic rhinitis   . Anxiety   . Axillary mass 08/2012   left  . Dental crowns present   . Diabetes mellitus    NIDDM  . Diverticulosis of sigmoid colon 08/18/2012   and descending colon  . GERD (gastroesophageal reflux disease)    seldom  . Hemorrhoids   . High triglycerides   . Hypertension    under control with meds., has been on med. x 5-6 yr.  . Interstitial cystitis    has had no problems in 25 yr.  . NASH (nonalcoholic steatohepatitis)   . PCOS (polycystic ovarian syndrome)       Family History  Problem Relation Age of Onset  . Hypertension Mother   . Cancer Maternal Grandmother        breast    Social History   Social History Narrative   Daily caffeine    Married for 86yrs.   Social History   Tobacco Use  . Smoking status: Never Smoker  . Smokeless tobacco: Never Used  Substance Use Topics  . Alcohol use: No    Current Meds  Medication Sig  . atorvastatin (LIPITOR) 80 MG tablet Take 1 tablet by mouth once daily  . Cholecalciferol (VITAMIN D-3) 125 MCG (5000 UT) TABS Take 10,000 Units by mouth daily at 12 noon.  . citalopram  (CELEXA) 40 MG tablet Take 1 tablet (40 mg total) by mouth daily.  Marland Kitchen estradiol (ESTRACE) 2 MG tablet Take 2 tablets (4 mg total) by mouth daily.  . fluconazole (DIFLUCAN) 150 MG tablet Take 1 tablet (150 mg total) by mouth daily.  . hydrochlorothiazide (HYDRODIURIL) 25 MG tablet Take 1 tablet (25 mg total) by mouth daily.  Marland Kitchen liraglutide (VICTOZA) 18 MG/3ML SOPN INJECT 1.8 MG SUBCUTANEOUSLY ONCE DAILY  . losartan (COZAAR) 50 MG tablet Take 1 tablet (50 mg total) by mouth daily.  . Melatonin 1 MG CAPS Take 1 capsule by mouth at bedtime.  . metFORMIN (GLUCOPHAGE) 500 MG tablet Take 1,000 mg by mouth 2 (two) times daily with a meal.  . NP THYROID 120 MG tablet Take 1 tablet (120 mg total) by mouth daily before breakfast.  . pantoprazole (PROTONIX) 40 MG tablet Take 40 mg by mouth daily.  . potassium chloride SA (K-DUR) 20 MEQ tablet Take 40 mEq by mouth daily.  . progesterone (PROMETRIUM) 200 MG capsule Take 400 mg by mouth daily.  . Testosterone 20 % CREA Apply 5 mg topically daily.  Marland Kitchen tobramycin (TOBREX) 0.3 % ophthalmic solution Place 1 drop into both eyes every 4 (four) hours.  . vitamin B-12 (CYANOCOBALAMIN) 250 MCG tablet Take 250 mcg by mouth daily.  Marland Kitchen  vitamin C (ASCORBIC ACID) 500 MG tablet Take 500 mg by mouth daily.  Marland Kitchen zinc gluconate 50 MG tablet Take 50 mg by mouth daily.  . [DISCONTINUED] amoxicillin-clavulanate (AUGMENTIN) 875-125 MG tablet Take 1 tablet by mouth 2 (two) times daily.  . [DISCONTINUED] fluconazole (DIFLUCAN) 150 MG tablet Take 1 tablet by mouth once daily  . [DISCONTINUED] guaiFENesin-codeine 100-10 MG/5ML syrup Take 10 mLs by mouth 3 (three) times daily as needed for cough.  . [DISCONTINUED] hydrochlorothiazide (HYDRODIURIL) 25 MG tablet Take 1 tablet (25 mg total) by mouth daily.  . [DISCONTINUED] predniSONE (DELTASONE) 20 MG tablet TAKE 2 TABLETS BY MOUTH ONCE DAILY WITH BREAKFAST  . [DISCONTINUED] TESTOSTERONE CYPIONATE IM Inject 0.3 mLs into the muscle 2 (two)  times a week. 25mg /ml concentration,compounded       Objective:   Today's Vitals: BP (!) 144/88 (BP Location: Right Arm, Patient Position: Sitting, Cuff Size: Normal)   Pulse 90   Temp 98.4 F (36.9 C) (Oral)   Resp 18   Ht 5\' 4"  (1.626 m)   Wt 229 lb (103.9 kg)   SpO2 99% Comment: wearing mask.  BMI 39.31 kg/m  Vitals with BMI 08/05/2019 05/07/2019 01/28/2019  Height 5\' 4"  5\' 4"  5\' 4"   Weight 229 lbs 231 lbs 6 oz 215 lbs 6 oz  BMI 39.29 [id number]  Systolic 123456 [id number]  Diastolic 88 79 88  Pulse 90 96 72     Physical Exam   She looks systemically well.  She has lost a couple of pounds since the last visit.  Blood pressure somewhat elevated.    Assessment   1. Female hypogonadism syndrome   2. Type 2 diabetes mellitus with other specified complication, without long-term current use of insulin ([place])   3. Morbid obesity ([place])   4. Essential hypertension, benign   5. Mixed hyperlipidemia   6. Vitamin D deficiency disease       Tests ordered No orders of the defined types were placed in this encounter.    Plan: 1. We will change her Victoza to Trulicity once a week at a higher dose to see if this will help her. 2. I recommended to her that she does stick to a plant-based diet with intermittent fasting as before and it is going to be somewhat difficult with prednisone if she keeps on having prednisone and she understands this. 3. Hypertension is reasonable, slightly elevated today and she will continue with the same medications. 4. I have refilled her hydrochlorothiazide today for hypertension. 5. I will see her in about a month's time for close follow-up and we will do blood work then.   Meds ordered this encounter  Medications  . Dulaglutide (TRULICITY) 3 0000000 SOPN    Sig: Inject 3 mg into the skin once a week.    Dispense:  4 pen    Refill:  3  . hydrochlorothiazide (HYDRODIURIL) 25 MG tablet    Sig: Take 1 tablet (25 mg total) by mouth daily.     Dispense:  90 tablet    Refill:  1  . fluconazole (DIFLUCAN) 150 MG tablet    Sig: Take 1 tablet (150 mg total) by mouth daily.    Dispense:  3 tablet    Refill:  0    Britini Garcilazo Luther Parody, MD

## 2019-08-05 NOTE — Patient Instructions (Signed)
[facility] Dietary Recommendations for Weight Loss What to Avoid . Avoid added sugars o Often added sugar can be found in processed foods such as many condiments, dry cereals, cakes, cookies, chips, crisps, crackers, candies, sweetened drinks, etc.  o Read labels and AVOID/DECREASE use of foods with the following in their ingredient list: Sugar, fructose, high fructose corn syrup, sucrose, glucose, maltose, dextrose, molasses, cane sugar, brown sugar, any type of syrup, agave nectar, etc.   . Avoid snacking in between meals . Avoid foods made with flour o If you are going to eat food made with flour, choose those made with whole-grains; and, minimize your consumption as much as is tolerable . Avoid processed foods o These foods are generally stocked in the middle of the grocery store. Focus on shopping on the perimeter of the grocery.  . Avoid Meat  o We recommend following a plant-based diet at [facility]. Thus, we recommend avoiding meat as a general rule. Consider eating beans, legumes, eggs, and/or dairy products for regular protein sources o If you plan on eating meat limit to 4 ounces of meat at a time and choose lean options such as Fish, chicken, turkey. Avoid red meat intake such as pork and/or steak What to Include . Vegetables o GREEN LEAFY VEGETABLES: Kale, spinach, mustard greens, collard greens, cabbage, broccoli, etc. o OTHER: Asparagus, cauliflower, eggplant, carrots, peas, Brussel sprouts, tomatoes, bell peppers, zucchini, beets, cucumbers, etc. . Grains, seeds, and legumes o Beans: kidney beans, black eyed peas, garbanzo beans, black beans, pinto beans, etc. o Whole, unrefined grains: brown rice, barley, bulgur, oatmeal, etc. . Healthy fats  o Avoid highly processed fats such as vegetable oil o Examples of healthy fats: avocado, olives, virgin olive oil, dark chocolate (?72% Cocoa), nuts (peanuts, almonds, walnuts, cashews, pecans, etc.) . None to Low  Intake of Animal Sources of Protein o Meat sources: chicken, turkey, salmon, tuna. Limit to 4 ounces of meat at one time. o Consider limiting dairy sources, but when choosing dairy focus on: PLAIN Greek yogurt, cottage cheese, high-protein milk . Fruit o Choose berries  When to Eat . Intermittent Fasting: o Choosing not to eat for a specific time period, but DO FOCUS ON HYDRATION when fasting o Multiple Techniques: - Time Restricted Eating: eat 3 meals in a day, each meal lasting no more than 60 minutes, no snacks between meals - 16-18 hour fast: fast for 16 to 18 hours up to 7 days a week. Often suggested to start with 2-3 nonconsecutive days per week.  . Remember the time you sleep is counted as fasting.  . Examples of eating schedule: Fast from 7:00pm-11:00am. Eat between 11:00am-7:00pm.  - 24-hour fast: fast for 24 hours up to every other day. Often suggested to start with 1 day per week . Remember the time you sleep is counted as fasting . Examples of eating schedule:  o Eating day: eat 2-3 meals on your eating day. If doing 2 meals, each meal should last no more than 90 minutes. If doing 3 meals, each meal should last no more than 60 minutes. Finish last meal by 7:00pm. o Fasting day: Fast until 7:00pm.  o IF YOU FEEL UNWELL FOR ANY REASON/IN ANY WAY WHEN FASTING, STOP FASTING BY EATING A NUTRITIOUS SNACK OR LIGHT MEAL o ALWAYS FOCUS ON HYDRATION DURING FASTS - Acceptable Hydration sources: water, broths, tea/coffee (black tea/coffee is best but using a small amount of whole-fat dairy products in coffee/tea is acceptable).  -   Poor Hydration Sources: anything with sugar or artificial sweeteners added to it  These recommendations have been developed for patients that are actively receiving medical care from either Dr. Medford Staheli or Sarah Gray, DNP, NP-C at [facility]. These recommendations are developed for patients with specific medical conditions and are not meant to be  distributed or used by others that are not actively receiving care from either provider listed above at [facility]. It is not appropriate to participate in the above eating plans without proper medical supervision.   Reference: Fung, J. The obesity code. [place]: [place]; 2016.   

## 2019-09-02 ENCOUNTER — Other Ambulatory Visit: Payer: Self-pay | Admitting: Obstetrics and Gynecology

## 2019-09-02 DIAGNOSIS — R928 Other abnormal and inconclusive findings on diagnostic imaging of breast: Secondary | ICD-10-CM

## 2019-09-03 ENCOUNTER — Encounter (INDEPENDENT_AMBULATORY_CARE_PROVIDER_SITE_OTHER): Payer: Self-pay | Admitting: Internal Medicine

## 2019-09-03 ENCOUNTER — Other Ambulatory Visit (INDEPENDENT_AMBULATORY_CARE_PROVIDER_SITE_OTHER): Payer: Self-pay | Admitting: Internal Medicine

## 2019-09-03 ENCOUNTER — Ambulatory Visit (INDEPENDENT_AMBULATORY_CARE_PROVIDER_SITE_OTHER): Payer: BC Managed Care – PPO | Admitting: Internal Medicine

## 2019-09-04 ENCOUNTER — Other Ambulatory Visit (INDEPENDENT_AMBULATORY_CARE_PROVIDER_SITE_OTHER): Payer: Self-pay | Admitting: Internal Medicine

## 2019-09-09 ENCOUNTER — Ambulatory Visit
Admission: RE | Admit: 2019-09-09 | Discharge: 2019-09-09 | Disposition: A | Discharge status: Home / Self Care | Payer: BC Managed Care – PPO | Source: Ambulatory Visit | Attending: Obstetrics and Gynecology | Admitting: Obstetrics and Gynecology

## 2019-09-09 ENCOUNTER — Other Ambulatory Visit: Payer: Self-pay | Admitting: Obstetrics and Gynecology

## 2019-09-09 ENCOUNTER — Other Ambulatory Visit: Payer: Self-pay

## 2019-09-09 DIAGNOSIS — R928 Other abnormal and inconclusive findings on diagnostic imaging of breast: Secondary | ICD-10-CM

## 2019-09-09 DIAGNOSIS — N6489 Other specified disorders of breast: Secondary | ICD-10-CM

## 2019-09-11 ENCOUNTER — Telehealth (INDEPENDENT_AMBULATORY_CARE_PROVIDER_SITE_OTHER): Payer: Self-pay

## 2019-09-11 NOTE — Telephone Encounter (Signed)
I talked with the patient and let her know I have sent a request to Earleen Reaper in credentialing to check on this and find out what we need to do to get this corrected.    email sent 09/11/19 Good-Morning [facility] A patient called today and said that [facility] is telling her that Dr Anastasio Champion is no longer in the network.  Can you check on this and let me know what we need to do to correct this?  Thanks

## 2019-10-06 ENCOUNTER — Ambulatory Visit (INDEPENDENT_AMBULATORY_CARE_PROVIDER_SITE_OTHER): Payer: BC Managed Care – PPO | Admitting: Internal Medicine

## 2019-10-06 ENCOUNTER — Encounter (INDEPENDENT_AMBULATORY_CARE_PROVIDER_SITE_OTHER): Payer: Self-pay | Admitting: Internal Medicine

## 2019-10-06 ENCOUNTER — Other Ambulatory Visit: Payer: Self-pay

## 2019-10-06 VITALS — BP 135/90 | HR 104 | Temp 98.4°F | Ht 64.0 in | Wt 227.8 lb

## 2019-10-06 DIAGNOSIS — E1169 Type 2 diabetes mellitus with other specified complication: Secondary | ICD-10-CM

## 2019-10-06 DIAGNOSIS — E2839 Other primary ovarian failure: Secondary | ICD-10-CM

## 2019-10-06 DIAGNOSIS — R42 Dizziness and giddiness: Secondary | ICD-10-CM

## 2019-10-06 DIAGNOSIS — R1013 Epigastric pain: Secondary | ICD-10-CM

## 2019-10-06 DIAGNOSIS — I10 Essential (primary) hypertension: Secondary | ICD-10-CM

## 2019-10-06 DIAGNOSIS — E782 Mixed hyperlipidemia: Secondary | ICD-10-CM

## 2019-10-06 MED ORDER — PANTOPRAZOLE SODIUM 40 MG PO TBEC: 40.0000 mg | DELAYED_RELEASE_TABLET | Freq: Every day | ORAL | 1 refills | Status: DC

## 2019-10-06 MED ORDER — TRAMADOL HCL 50 MG PO TABS: 50.0000 mg | ORAL_TABLET | Freq: Three times a day (TID) | ORAL | 0 refills | Status: AC | PRN

## 2019-10-06 NOTE — Progress Notes (Signed)
Metrics: Intervention Frequency ACO  Documented Smoking Status Yearly  Screened one or more times in 24 months  Cessation Counseling or  Active cessation medication Past 24 months  Past 24 months   Guideline developer: UpToDate (See UpToDate for funding source) Date Released: 2014       Wellness Office Visit  Subjective:  Patient ID: Isabella Juarez, female    DOB: [birth year]  Age: 54 y.o. MRN: [id number]  CC: This lady comes in for follow-up of diabetes, identical hormone therapy, hypertension, obesity, hyperlipidemia. HPI  She has a couple of concerns today.  She describes vertigo symptoms for the last 3 weeks or so.  Every time she moves her head, she feels very dizzy.  She has had a longstanding history of left ear tinnitus. She also describes right lateral chest/upper back pain.  She has had this for about 10 days.  She tells me about 2 weeks ago she was doing spring cleaning. She continues with diabetic medications including Trulicity. She continues on antihypertensive therapy also. On the last visit, her progesterone levels were very low and I was puzzled by this so we will repeat this today as well as her other hormones. She is also complaining of epigastric pain and she has been taking a lot of over-the-counter antacids. Past Medical History:  Diagnosis Date  . Allergic rhinitis   . Anxiety   . Axillary mass 08/2012   left  . Dental crowns present   . Diabetes mellitus    NIDDM  . Diverticulosis of sigmoid colon 08/18/2012   and descending colon  . GERD (gastroesophageal reflux disease)    seldom  . Hemorrhoids   . High triglycerides   . Hypertension    under control with meds., has been on med. x 5-6 yr.  . Interstitial cystitis    has had no problems in 25 yr.  . NASH (nonalcoholic steatohepatitis)   . PCOS (polycystic ovarian syndrome)       Family History  Problem Relation Age of Onset  . Hypertension Mother   . Cancer Maternal Grandmother        breast     Social History   Social History Narrative   Daily caffeine    Married for 55yrs.   Social History   Tobacco Use  . Smoking status: Never Smoker  . Smokeless tobacco: Never Used  Substance Use Topics  . Alcohol use: No    Current Meds  Medication Sig  . atorvastatin (LIPITOR) 80 MG tablet Take 1 tablet by mouth once daily  . Cholecalciferol (VITAMIN D-3) 125 MCG (5000 UT) TABS Take 10,000 Units by mouth daily at 12 noon.  . citalopram (CELEXA) 40 MG tablet Take 1 tablet (40 mg total) by mouth daily.  . Dulaglutide (TRULICITY) 3 0000000 SOPN Inject 3 mg into the skin once a week.  . estradiol (ESTRACE) 2 MG tablet Take 2 tablets (4 mg total) by mouth daily.  . hydrochlorothiazide (HYDRODIURIL) 25 MG tablet Take 1 tablet (25 mg total) by mouth daily.  Marland Kitchen losartan (COZAAR) 50 MG tablet Take 1 tablet by mouth once daily  . Melatonin 1 MG CAPS Take 1 capsule by mouth at bedtime.  . metFORMIN (GLUCOPHAGE-XR) 500 MG 24 hr tablet Take 2 tablets by mouth twice daily  . NP THYROID 120 MG tablet Take 1 tablet (120 mg total) by mouth daily before breakfast.  . pantoprazole (PROTONIX) 40 MG tablet Take 1 tablet (40 mg total) by mouth daily.  . potassium  chloride SA (K-DUR) 20 MEQ tablet Take 40 mEq by mouth daily.  . progesterone (PROMETRIUM) 200 MG capsule Take 400 mg by mouth daily.  . Testosterone 20 % CREA Apply 5 mg topically daily.  Marland Kitchen tobramycin (TOBREX) 0.3 % ophthalmic solution Place 1 drop into both eyes every 4 (four) hours.  . vitamin B-12 (CYANOCOBALAMIN) 250 MCG tablet Take 250 mcg by mouth daily.  . vitamin C (ASCORBIC ACID) 500 MG tablet Take 500 mg by mouth daily.  Marland Kitchen zinc gluconate 50 MG tablet Take 50 mg by mouth daily.  . [DISCONTINUED] pantoprazole (PROTONIX) 40 MG tablet Take 40 mg by mouth daily.       Objective:   Today's Vitals: BP 135/90 (BP Location: Right Arm, Patient Position: Sitting, Cuff Size: Normal)   Pulse (!) 104   Temp 98.4 F (36.9 C)  (Temporal)   Ht 5\' 4"  (1.626 m)   Wt 227 lb 12.8 oz (103.3 kg)   SpO2 96%   BMI 39.10 kg/m  Vitals with BMI 10/06/2019 08/05/2019 05/07/2019  Height 5\' 4"  5\' 4"  5\' 4"   Weight 227 lbs 13 oz 229 lbs 231 lbs 6 oz  BMI 39.08 [id number]  Systolic [id number]  Diastolic 90 88 79  Pulse [id number] 90 96     Physical Exam  She does not appear to be in pain.  She has lost a couple of pounds since the last visit.  Blood pressure is reasonable for her.  Examination of her chest shows tenderness in the right lateral chest area reproducing the pain.  Lung fields are clear with no evidence of pleural rub.  There is no pericardial rub.  In the site of the pain, there is no rash such as a shingles rash. Neurological examination does not show any focal neurological signs, especially there are no cerebellar signs.     Assessment   1. Type 2 diabetes mellitus with other specified complication, without long-term current use of insulin ([place])   2. Female hypogonadism syndrome   3. Morbid obesity ([place])   4. Essential hypertension, benign   5. Mixed hyperlipidemia   6. Vertigo   7. Epigastric pain       Tests ordered Orders Placed This Encounter  Procedures  . COMPLETE METABOLIC PANEL WITH GFR  . Hemoglobin A1c  . Progesterone  . Estradiol  . Testos,Total,Free and SHBG (Female)  . Ambulatory referral to ENT     Plan: 1. Blood work is ordered. 2. As far as the epigastric pain is concerned, I told her to take Protonix every day now and if she does not improve when I see you the next time, I will refer to gastroenterology. 3. As far as her vertigo is concerned, I will refer her to ENT, Dr. Benjamine Mola. 4. As far as her chest tenderness is concerned, I think this is muscular in nature, I have sent a prescription for tramadol.  She is not able to tolerate nonsteroidal anti-inflammatory medications and Tylenol may not be sufficient. 5. I will see her in a month's time for close follow-up.    Meds ordered  this encounter  Medications  . pantoprazole (PROTONIX) 40 MG tablet    Sig: Take 1 tablet (40 mg total) by mouth daily.    Dispense:  90 tablet    Refill:  1  . traMADol (ULTRAM) 50 MG tablet    Sig: Take 1 tablet (50 mg total) by mouth every 8 (eight) hours as needed for up to 5  days.    Dispense:  15 tablet    Refill:  0    Dezaray Shibuya Luther Parody, MD

## 2019-10-10 LAB — COMPLETE METABOLIC PANEL WITH GFR
AG Ratio: 1.6 (calc) (ref 1.0–2.5)
ALT: 50 U/L — ABNORMAL HIGH (ref 6–29)
AST: 58 U/L — ABNORMAL HIGH (ref 10–35)
Albumin: 4.1 g/dL (ref 3.6–5.1)
Alkaline phosphatase (APISO): 87 U/L (ref 37–153)
BUN: 14 mg/dL (ref 7–25)
CO2: 29 mmol/L (ref 20–32)
Calcium: 9.4 mg/dL (ref 8.6–10.4)
Chloride: 99 mmol/L (ref 98–110)
Creat: 0.77 mg/dL (ref 0.50–1.05)
GFR, Est African American: 101 mL/min/{1.73_m2} (ref >=60)
GFR, Est Non African American: 87 mL/min/{1.73_m2} (ref >=60)
Globulin: 2.5 g/dL (calc) (ref 1.9–3.7)
Glucose, Bld: 137 mg/dL — ABNORMAL HIGH (ref 65–99)
Potassium: 3.7 mmol/L (ref 3.5–5.3)
Sodium: 137 mmol/L (ref 135–146)
Total Bilirubin: 0.7 mg/dL (ref 0.2–1.2)
Total Protein: 6.6 g/dL (ref 6.1–8.1)

## 2019-10-10 LAB — HEMOGLOBIN A1C
Hgb A1c MFr Bld: 8.6 % of total Hgb — ABNORMAL HIGH (ref <5.7)
Mean Plasma Glucose: 200 (calc)
eAG (mmol/L): 11.1 (calc)

## 2019-10-10 LAB — TESTOS,TOTAL,FREE AND SHBG (FEMALE)
Free Testosterone: 1 pg/mL (ref 0.1–6.4)
Sex Hormone Binding: 79 nmol/L (ref 17–124)
Testosterone, Total, LC-MS-MS: 14 ng/dL (ref 2–45)

## 2019-10-10 LAB — ESTRADIOL: Estradiol: 64 pg/mL

## 2019-10-10 LAB — PROGESTERONE: Progesterone: 4.7 ng/mL

## 2019-11-02 ENCOUNTER — Ambulatory Visit (INDEPENDENT_AMBULATORY_CARE_PROVIDER_SITE_OTHER): Payer: BC Managed Care – PPO | Admitting: Internal Medicine

## 2019-11-02 ENCOUNTER — Encounter (INDEPENDENT_AMBULATORY_CARE_PROVIDER_SITE_OTHER): Payer: Self-pay | Admitting: Internal Medicine

## 2019-11-02 ENCOUNTER — Other Ambulatory Visit: Payer: Self-pay

## 2019-11-02 VITALS — BP 146/90 | HR 104 | Temp 97.9°F | Resp 18 | Ht 65.0 in | Wt 224.0 lb

## 2019-11-02 DIAGNOSIS — R1013 Epigastric pain: Secondary | ICD-10-CM | POA: Diagnosis not present

## 2019-11-02 DIAGNOSIS — E2839 Other primary ovarian failure: Secondary | ICD-10-CM

## 2019-11-02 DIAGNOSIS — E1169 Type 2 diabetes mellitus with other specified complication: Secondary | ICD-10-CM | POA: Diagnosis not present

## 2019-11-02 MED ORDER — FIRST-TESTOSTERONE MC 2 % TD CREA
10.0000 mg | TOPICAL_CREAM | Freq: Every day | TRANSDERMAL | 0 refills | Status: DC
Start: 2019-11-02 — End: 2020-04-04

## 2019-11-02 NOTE — Progress Notes (Signed)
Metrics: Intervention Frequency ACO  Documented Smoking Status Yearly  Screened one or more times in 24 months  Cessation Counseling or  Active cessation medication Past 24 months  Past 24 months   Guideline developer: UpToDate (See UpToDate for funding source) Date Released: 2014       Wellness Office Visit  Subjective:  Patient ID: Isabella Juarez, female    DOB: [birth year]  Age: 54 y.o. MRN: [id number]  CC: This lady comes in for follow-up of her symptoms from the last visit which included epigastric discomfort, dizziness and tinnitus.  She also comes in to reevaluate her blood work and her diabetes, obesity. HPI  She went to see ENT and was told that she had tinnitus and likely would not improve completely but was recommended hearing aids. The chest pain that she did have has now finally resolved, I felt it was musculoskeletal in nature. The epigastric discomfort has not improved despite taking Protonix every day. She has managed to lose some weight since the last visit and she is trying to do better with eating.  She does intermittent fasting almost on a daily basis.  She does still eat animal proteins. We reviewed her blood work from the last visit which shows a hemoglobin A1c that is improved but still above 8%.  Her progesterone levels are still low but thankfully she has no uterus at this point. Her testosterone levels are still suboptimal and she was supposed to be taking testosterone cream 5 mg apply to the labia every day but she has not seen any symptomatic benefit with this. Past Medical History:  Diagnosis Date  . Allergic rhinitis   . Anxiety   . Axillary mass 08/2012   left  . Dental crowns present   . Diabetes mellitus    NIDDM  . Diverticulosis of sigmoid colon 08/18/2012   and descending colon  . GERD (gastroesophageal reflux disease)    seldom  . Hemorrhoids   . High triglycerides   . Hypertension    under control with meds., has been on med. x 5-6 yr.  .  Interstitial cystitis    has had no problems in 25 yr.  . NASH (nonalcoholic steatohepatitis)   . PCOS (polycystic ovarian syndrome)    Past Surgical History:  Procedure Laterality Date  . ABDOMINAL HYSTERECTOMY    . CESAREAN SECTION  08/02/1987, 12/15/1990  . CHOLECYSTECTOMY  01/05/2005   lap. chole.  Marland Kitchen COLONOSCOPY W/ POLYPECTOMY  08/18/2012  . LIVER BIOPSY  01/05/2005  . MASS EXCISION Left 09/04/2012   Procedure: Excision Left Axillary Mass 10 cm;  Surgeon: Haywood Lasso, MD;  Location: [facility];  Service: General;  Laterality: Left;  . TOTAL ABDOMINAL HYSTERECTOMY W/ BILATERAL SALPINGOOPHORECTOMY  03/16/2009     Family History  Problem Relation Age of Onset  . Hypertension Mother   . Cancer Maternal Grandmother        breast    Social History   Social History Narrative   Daily caffeine    Married for 72yrs.   Social History   Tobacco Use  . Smoking status: Never Smoker  . Smokeless tobacco: Never Used  Substance Use Topics  . Alcohol use: No    Current Meds  Medication Sig  . atorvastatin (LIPITOR) 80 MG tablet Take 1 tablet by mouth once daily  . Cholecalciferol (VITAMIN D-3) 125 MCG (5000 UT) TABS Take 10,000 Units by mouth daily at 12 noon.  . citalopram (CELEXA) 40 MG tablet Take  1 tablet (40 mg total) by mouth daily.  . Dulaglutide (TRULICITY) 3 LG/9.2JJ SOPN Inject 3 mg into the skin once a week.  . estradiol (ESTRACE) 2 MG tablet Take 2 tablets (4 mg total) by mouth daily.  . hydrochlorothiazide (HYDRODIURIL) 25 MG tablet Take 1 tablet (25 mg total) by mouth daily.  Marland Kitchen losartan (COZAAR) 50 MG tablet Take 1 tablet by mouth once daily  . Melatonin 1 MG CAPS Take 1 capsule by mouth at bedtime.  . metFORMIN (GLUCOPHAGE-XR) 500 MG 24 hr tablet Take 2 tablets by mouth twice daily  . methocarbamol (ROBAXIN) 500 MG tablet Take 500 mg by mouth at bedtime.  . NP THYROID 120 MG tablet Take 1 tablet (120 mg total) by mouth daily before breakfast.    . pantoprazole (PROTONIX) 40 MG tablet Take 1 tablet (40 mg total) by mouth daily.  . potassium chloride SA (K-DUR) 20 MEQ tablet Take 40 mEq by mouth daily.  . progesterone (PROMETRIUM) 200 MG capsule Take 400 mg by mouth daily.  . Testosterone 20 % CREA Apply 5 mg topically daily.  Marland Kitchen tobramycin (TOBREX) 0.3 % ophthalmic solution Place 1 drop into both eyes every 4 (four) hours.  . vitamin B-12 (CYANOCOBALAMIN) 250 MCG tablet Take 250 mcg by mouth daily.  . vitamin C (ASCORBIC ACID) 500 MG tablet Take 500 mg by mouth daily.  Marland Kitchen zinc gluconate 50 MG tablet Take 50 mg by mouth daily.      No flowsheet data found.   Objective:   Today's Vitals: BP (!) 146/90 (BP Location: Right Arm, Patient Position: Sitting, Cuff Size: Normal) Comment: just left work.  Pulse (!) 104   Temp 97.9 F (36.6 C) (Temporal)   Resp 18   Ht 5\' 5"  (1.651 m)   Wt 224 lb (101.6 kg)   SpO2 98%   BMI 37.28 kg/m  Vitals with BMI 11/02/2019 10/06/2019 08/05/2019  Height 5\' 5"  5\' 4"  5\' 4"   Weight 224 lbs 227 lbs 13 oz 229 lbs  BMI 37.28 94.17 40.81  Systolic 448 185 631  Diastolic 90 90 88  Pulse 497 104 90     Physical Exam   She remains obese but has lost 3 pounds since the last visit.  Blood pressure slightly elevated today still.    Assessment   1. Epigastric pain   2. Type 2 diabetes mellitus with other specified complication, without long-term current use of insulin ([place])   3. Morbid obesity ([place])   4. Female hypogonadism syndrome       Tests ordered Orders Placed This Encounter  Procedures  . Ambulatory referral to Gastroenterology     Plan: 1. As far as her epigastric discomfort is concerned, I will refer to gastroenterology.  She may need EGD. 2. She will continue to focus on nutrition for her diet and morbid obesity.  I reinforced the idea of intermittent fasting but also stressed the importance of a plant-based diet now. 3. I am going to increase the testosterone cream dose to 10  mg daily applied to the labia and I will call this into [place]. 4. I will see her in about a month's time to see how she is doing.   No orders of the defined types were placed in this encounter.   Doree Albee, MD

## 2019-11-03 ENCOUNTER — Encounter (INDEPENDENT_AMBULATORY_CARE_PROVIDER_SITE_OTHER): Payer: Self-pay

## 2019-11-23 ENCOUNTER — Other Ambulatory Visit (INDEPENDENT_AMBULATORY_CARE_PROVIDER_SITE_OTHER): Payer: Self-pay | Admitting: Internal Medicine

## 2019-11-24 ENCOUNTER — Other Ambulatory Visit (INDEPENDENT_AMBULATORY_CARE_PROVIDER_SITE_OTHER): Payer: Self-pay | Admitting: Internal Medicine

## 2019-11-24 ENCOUNTER — Telehealth (INDEPENDENT_AMBULATORY_CARE_PROVIDER_SITE_OTHER): Payer: Self-pay

## 2019-11-24 MED ORDER — ONDANSETRON HCL 4 MG PO TABS: 4.0000 mg | ORAL_TABLET | Freq: Three times a day (TID) | ORAL | 0 refills | Status: AC | PRN

## 2019-11-24 NOTE — Telephone Encounter (Signed)
Okay, which pharmacy does she want to use?

## 2019-11-24 NOTE — Telephone Encounter (Signed)
Send her to [facility].

## 2019-11-24 NOTE — Progress Notes (Signed)
zofra

## 2019-11-25 ENCOUNTER — Telehealth (INDEPENDENT_AMBULATORY_CARE_PROVIDER_SITE_OTHER): Payer: Self-pay

## 2019-11-25 ENCOUNTER — Other Ambulatory Visit (INDEPENDENT_AMBULATORY_CARE_PROVIDER_SITE_OTHER): Payer: Self-pay | Admitting: Internal Medicine

## 2019-11-25 ENCOUNTER — Other Ambulatory Visit (INDEPENDENT_AMBULATORY_CARE_PROVIDER_SITE_OTHER): Payer: Self-pay

## 2019-11-25 DIAGNOSIS — R197 Diarrhea, unspecified: Secondary | ICD-10-CM

## 2019-11-25 MED ORDER — DIPHENOXYLATE-ATROPINE 2.5-0.025 MG PO TABS: 1.0000 | ORAL_TABLET | Freq: Four times a day (QID) | ORAL | Status: DC | PRN

## 2019-11-25 NOTE — Telephone Encounter (Signed)
Pat sen a message form [facility] asking :  Do I need to call [facility] to schedule an appt or will you do that? Also is there any way to call in diphenoxylate atropine for the diarrhea. I can't get too far from a bathroom and that's taking Imodium which I don't think is helping at all. I am miserable.

## 2019-11-25 NOTE — Telephone Encounter (Signed)
I have sent this medication to [facility].

## 2019-11-30 ENCOUNTER — Other Ambulatory Visit: Payer: Self-pay

## 2019-11-30 ENCOUNTER — Encounter (INDEPENDENT_AMBULATORY_CARE_PROVIDER_SITE_OTHER): Payer: Self-pay | Admitting: Internal Medicine

## 2019-11-30 ENCOUNTER — Ambulatory Visit (INDEPENDENT_AMBULATORY_CARE_PROVIDER_SITE_OTHER): Payer: BC Managed Care – PPO | Admitting: Internal Medicine

## 2019-11-30 DIAGNOSIS — I10 Essential (primary) hypertension: Secondary | ICD-10-CM | POA: Diagnosis not present

## 2019-11-30 DIAGNOSIS — E2839 Other primary ovarian failure: Secondary | ICD-10-CM | POA: Diagnosis not present

## 2019-11-30 DIAGNOSIS — R197 Diarrhea, unspecified: Secondary | ICD-10-CM

## 2019-11-30 MED ORDER — DIPHENOXYLATE-ATROPINE 2.5-0.025 MG PO TABS: 1.0000 | ORAL_TABLET | Freq: Four times a day (QID) | ORAL | 1 refills | Status: DC

## 2019-11-30 MED ORDER — MECLIZINE HCL 12.5 MG PO TABS: 12.5000 mg | ORAL_TABLET | Freq: Three times a day (TID) | ORAL | 2 refills | Status: DC | PRN

## 2019-11-30 NOTE — Progress Notes (Signed)
Metrics: Intervention Frequency ACO  Documented Smoking Status Yearly  Screened one or more times in 24 months  Cessation Counseling or  Active cessation medication Past 24 months  Past 24 months   Guideline developer: UpToDate (See UpToDate for funding source) Date Released: 2014       Wellness Office Visit  Subjective:  Patient ID: Isabella Juarez, female    DOB: [birth year]  Age: 54 y.o. MRN: [id number]  CC: This lady comes in for follow-up of her most recent diarrhea, diabetes, hypertension and menopausal symptoms. HPI  Her most pressing issue is diarrhea which she has now had for almost 5 weeks.  She denies any rectal bleeding, fever, body aches any other systemic symptoms.  There is some nausea but no vomiting.  I have treated her with Lomotil which seems to help her.  We are trying to get her an appointment with gastroenterology.  She did have COVID-19 disease in October of last year and has not been vaccinated so far.  However, she denies any respiratory symptoms that might indicate COVID-19 disease although diarrhea is one of the symptoms for COVID-19 also. On the last visit, I did increase the testosterone cream dose to 10 mg daily and she has tolerated this without any problems. Past Medical History:  Diagnosis Date  . Allergic rhinitis   . Anxiety   . Axillary mass 08/2012   left  . Dental crowns present   . Diabetes mellitus    NIDDM  . Diverticulosis of sigmoid colon 08/18/2012   and descending colon  . GERD (gastroesophageal reflux disease)    seldom  . Hemorrhoids   . High triglycerides   . Hypertension    under control with meds., has been on med. x 5-6 yr.  . Interstitial cystitis    has had no problems in 25 yr.  . NASH (nonalcoholic steatohepatitis)   . PCOS (polycystic ovarian syndrome)    Past Surgical History:  Procedure Laterality Date  . ABDOMINAL HYSTERECTOMY    . CESAREAN SECTION  08/02/1987, 12/15/1990  . CHOLECYSTECTOMY  01/05/2005   lap. chole.    Marland Kitchen COLONOSCOPY W/ POLYPECTOMY  08/18/2012  . LIVER BIOPSY  01/05/2005  . MASS EXCISION Left 09/04/2012   Procedure: Excision Left Axillary Mass 10 cm;  Surgeon: Haywood Lasso, MD;  Location: [facility];  Service: General;  Laterality: Left;  . TOTAL ABDOMINAL HYSTERECTOMY W/ BILATERAL SALPINGOOPHORECTOMY  03/16/2009     Family History  Problem Relation Age of Onset  . Hypertension Mother   . Cancer Maternal Grandmother        breast    Social History   Social History Narrative   Daily caffeine    Married for 61yrs.   Social History   Tobacco Use  . Smoking status: Never Smoker  . Smokeless tobacco: Never Used  Substance Use Topics  . Alcohol use: No    Current Meds  Medication Sig  . atorvastatin (LIPITOR) 80 MG tablet Take 1 tablet by mouth once daily  . Cholecalciferol (VITAMIN D-3) 125 MCG (5000 UT) TABS Take 10,000 Units by mouth daily at 12 noon.  . citalopram (CELEXA) 40 MG tablet Take 1 tablet (40 mg total) by mouth daily.  . diphenoxylate-atropine (LOMOTIL) 2.5-0.025 MG tablet Take 1 tablet by mouth 4 (four) times daily.  Marland Kitchen estradiol (ESTRACE) 2 MG tablet Take 2 tablets (4 mg total) by mouth daily.  . hydrochlorothiazide (HYDRODIURIL) 25 MG tablet Take 1 tablet (25 mg total) by  mouth daily.  Marland Kitchen losartan (COZAAR) 50 MG tablet Take 1 tablet by mouth once daily  . Melatonin 1 MG CAPS Take 1 capsule by mouth at bedtime.  . metFORMIN (GLUCOPHAGE-XR) 500 MG 24 hr tablet Take 2 tablets by mouth twice daily  . methocarbamol (ROBAXIN) 500 MG tablet Take 500 mg by mouth at bedtime.  . NP THYROID 120 MG tablet Take 1 tablet (120 mg total) by mouth daily before breakfast.  . ondansetron (ZOFRAN) 4 MG tablet Take 1 tablet (4 mg total) by mouth every 8 (eight) hours as needed for nausea or vomiting.  . pantoprazole (PROTONIX) 40 MG tablet Take 1 tablet (40 mg total) by mouth daily.  . potassium chloride SA (K-DUR) 20 MEQ tablet Take 40 mEq by mouth daily.  .  progesterone (PROMETRIUM) 200 MG capsule Take 400 mg by mouth daily.  . Testosterone 20 % CREA Apply 5 mg topically daily.  . Testosterone Propionate (FIRST-TESTOSTERONE MC) 2 % CREA Place 10 mg onto the skin daily.  Marland Kitchen tobramycin (TOBREX) 0.3 % ophthalmic solution Place 1 drop into both eyes every 4 (four) hours.  . TRULICITY 3 MB/5.5HR SOPN INJECT 3MG  INTO THE SKIN ONCE A WEEK  . vitamin B-12 (CYANOCOBALAMIN) 250 MCG tablet Take 250 mcg by mouth daily.  . vitamin C (ASCORBIC ACID) 500 MG tablet Take 500 mg by mouth daily.  Marland Kitchen zinc gluconate 50 MG tablet Take 50 mg by mouth daily.  . [DISCONTINUED] diphenoxylate-atropine (LOMOTIL) 2.5-0.025 MG tablet Take 1 tablet by mouth 4 (four) times daily.   Current Facility-Administered Medications for the 11/30/19 encounter (Office Visit) with Doree Albee, MD  Medication  . diphenoxylate-atropine (LOMOTIL) 2.5-0.025 MG per tablet 1 tablet      No flowsheet data found.   Objective:   Today's Vitals: BP 120/80 (BP Location: Right Arm, Patient Position: Sitting, Cuff Size: Normal)   Pulse 93   Temp 97.7 F (36.5 C) (Temporal)   Ht 5\' 5"  (1.651 m)   Wt 222 lb 6.4 oz (100.9 kg)   SpO2 95%   BMI 37.01 kg/m  Vitals with BMI 11/30/2019 11/02/2019 10/06/2019  Height 5\' 5"  5\' 5"  5\' 4"   Weight 222 lbs 6 oz 224 lbs 227 lbs 13 oz  BMI 37.01 41.63 84.53  Systolic 646 803 212  Diastolic 80 90 90  Pulse 93 104 104     Physical Exam   She looks systemically well.  She has lost a couple of pounds since the last visit.  Blood pressure is better.    Assessment   1. Morbid obesity ([place])   2. Female hypogonadism syndrome   3. Essential hypertension, benign   4. Diarrhea, unspecified type       Tests ordered No orders of the defined types were placed in this encounter.    Plan: 1. For the diarrhea, I will send a new prescription for Lomotil and we will try and get her an appointment with the gastroenterologist as soon as  possible. 2. Her hypertension is reasonably well controlled and she will continue with losartan and hydrochlorothiazide. 3. She has tolerated the higher dose of testosterone therapy and she will continue with the same. 4. I have prescribed for meclizine to see if this will help some of the nausea and dizziness feelings.  She was seen by ENT and the ENT surgeon told her that some of the changes in her inner ear may be to do with viral illness. 5. I will follow up with her  in about 6 weeks time and we will do blood work then.   Meds ordered this encounter  Medications  . meclizine (ANTIVERT) 12.5 MG tablet    Sig: Take 1 tablet (12.5 mg total) by mouth 3 (three) times daily as needed for dizziness.    Dispense:  30 tablet    Refill:  2  . diphenoxylate-atropine (LOMOTIL) 2.5-0.025 MG tablet    Sig: Take 1 tablet by mouth 4 (four) times daily.    Dispense:  60 tablet    Refill:  1    Majed Pellegrin Luther Parody, MD

## 2019-12-18 ENCOUNTER — Other Ambulatory Visit (INDEPENDENT_AMBULATORY_CARE_PROVIDER_SITE_OTHER): Payer: Self-pay | Admitting: Internal Medicine

## 2019-12-22 ENCOUNTER — Encounter (HOSPITAL_COMMUNITY): Payer: Self-pay | Admitting: Physical Therapy

## 2019-12-22 ENCOUNTER — Ambulatory Visit (HOSPITAL_COMMUNITY): Payer: BC Managed Care – PPO | Attending: Otolaryngology | Admitting: Physical Therapy

## 2019-12-22 ENCOUNTER — Other Ambulatory Visit: Payer: Self-pay

## 2019-12-22 DIAGNOSIS — R42 Dizziness and giddiness: Secondary | ICD-10-CM | POA: Diagnosis present

## 2019-12-22 NOTE — Patient Instructions (Signed)
Access Code: [email or web address] URL: [email or web address] Date: 12/22/2019 Prepared by: Josue Hector  Exercises Seated Gaze Stabilization with Head Rotation - 3 x daily - 7 x weekly - 2 sets - 10 reps Seated Horizontal Saccades - 3 x daily - 7 x weekly - 2 sets - 10 reps

## 2019-12-22 NOTE — Therapy (Signed)
[place], [place], [address] Phone: [phone]   Fax:  [phone]  Physical Therapy Evaluation  Patient Details  Name: Isabella Juarez MRN: [id number] Date of Birth: [birth year] Referring Provider (PT): Leta Baptist MD    Encounter Date: 12/22/2019   PT End of Session - 12/22/19 1621    Visit Number 1    Number of Visits 8    Date for PT Re-Evaluation 01/22/20    Authorization Type [employer]    PT Start Time 1515    PT Stop Time 1600    PT Time Calculation (min) 45 min    Activity Tolerance Patient tolerated treatment well    Behavior During Therapy [facility] for tasks assessed/performed           Past Medical History:  Diagnosis Date   Allergic rhinitis    Anxiety    Axillary mass 08/2012   left   Dental crowns present    Diabetes mellitus    NIDDM   Diverticulosis of sigmoid colon 08/18/2012   and descending colon   GERD (gastroesophageal reflux disease)    seldom   Hemorrhoids    High triglycerides    Hypertension    under control with meds., has been on med. x 5-6 yr.   Interstitial cystitis    has had no problems in 25 yr.   NASH (nonalcoholic steatohepatitis)    PCOS (polycystic ovarian syndrome)     Past Surgical History:  Procedure Laterality Date   ABDOMINAL HYSTERECTOMY     CESAREAN SECTION  08/02/1987, 12/15/1990   CHOLECYSTECTOMY  01/05/2005   lap. chole.   COLONOSCOPY W/ POLYPECTOMY  08/18/2012   LIVER BIOPSY  01/05/2005   MASS EXCISION Left 09/04/2012   Procedure: Excision Left Axillary Mass 10 cm;  Surgeon: Haywood Lasso, MD;  Location: [place];  Service: General;  Laterality: Left;   TOTAL ABDOMINAL HYSTERECTOMY W/ BILATERAL SALPINGOOPHORECTOMY  03/16/2009    There were no vitals filed for this visit.    Subjective Assessment - 12/22/19 1540    Subjective Patient presents to physical therapy with complaint of decreased balance and  dizziness. Patient reports having COVID in October 2020, and notes that these symptoms began in November. Says she had onset of dizzy episodes 4 x weekly. Says one day she work when she became very dizzy and nauseous. Says she had to leave work and go home to sleep. She went to specialist and had work up form ENT. 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 Patient was prescribed hearing aids and Robaxone. Patient says the hearing aids have helped with ringing in ears. Patient says she still gets nauseous occasionally, but not as bad, says frequency of episodes is down around 3 x weekly, also notes that intensity of dizziness has improved about 60%.    Limitations Standing;Walking;House hold activities    Patient Stated Goals not be dizzy when I stand up              [facility] PT Assessment - 12/22/19 0001      Assessment   Medical Diagnosis Dizziness     Referring Provider (PT) Leta Baptist MD     Onset Date/Surgical Date --   November 2020   Next MD Visit --   None scheduled      Precautions   Precautions Fall      Balance Screen  Has the patient fallen in the past 6 months Yes    How many times? 1    Has the patient had a decrease in activity level because of a fear of falling?  Yes    Is the patient reluctant to leave their home because of a fear of falling?  No      Home Ecologist residence      Prior Function   Level of Independence Independent      Cognition   Overall Cognitive Status Within Functional Limits for tasks assessed      Balance   Balance Assessed Yes      Static Standing Balance   Static Standing Balance -  Activities  Romberg - Eyes Opened;Tandam Stance - Right Leg;Tandam Stance - Left Leg;Single Leg Stance - Right Leg;Single Leg Stance - Left Leg    Static Standing - Comment/# of Minutes 30 sec mod sway, 20 sec mod sway; 15 sec mod sway; 10  sec mod sway; 10 sec mod sway    tendency to lean posteriorly with rhomberg                 Vestibular Assessment - 12/22/19 0001      Symptom Behavior   Frequency of Dizziness 3 x weekly     Symptom Nature Motion provoked    Aggravating Factors Turning head quickly;Sit to stand    Relieving Factors Dark room;Closing eyes;Rest    Progression of Symptoms Better      Oculomotor Exam   Ocular ROM Appears intact     Smooth Pursuits Comment   Noted nystagmus with LT tracking, slight onset of dizziness   Saccades Intact;Slow      Vestibulo-Ocular Reflex   VOR 1 Head Only (x 1 viewing) no nystagmus, but with increased headache sx and LT ear discomfort               Objective measurements completed on examination: See above findings.               PT Education - 12/22/19 1541    Education Details on evaluation findings, POC and HEP    Person(s) Educated Patient    Methods Explanation;Handout    Comprehension Verbalized understanding            PT Short Term Goals - 12/22/19 1626      PT SHORT TERM GOAL #1   Title Patient will be independent with initial HEP and self-management strategies to improve functional outcomes    Time 2    Period Weeks    Status New    Target Date 01/08/20             PT Long Term Goals - 12/22/19 1627      PT LONG TERM GOAL #1   Title Patient will report at least 60% overall improvement in subjective complaint to indicate improvement in ability to perform ADLs.    Time 4    Period Weeks    Status New    Target Date 01/22/20      PT LONG TERM GOAL #2   Title Patient will report no more than 2 episodes of dizziness in past 7 days to indicate improvement with functional outcomes    Time 4    Period Weeks    Status New    Target Date 01/22/20      PT LONG TERM GOAL #3   Title Patient will be able to maintain tandem  stance >30 seconds on BLEs to indicate improved stability and balance and reduce risk for falls     Time 4    Period Weeks    Status New    Target Date 01/22/20                  Plan - 12/22/19 1622    Clinical Impression Statement Patient is a 54 y.o. female who presents to physical therapy with complaint of dizziness and balance difficulty. Patient demonstrates balance deficits and increased dizziness with provocative vestibular testing which are negatively impacting patient ability to perform ADLs and functional mobility tasks. Patient will benefit from skilled physical therapy services to address these deficits to improve level of function with ADLs, functional mobility tasks, and reduce risk for falls.    Examination-Participation Restrictions Cleaning;Community Activity;Occupation    Stability/Clinical Decision Making Stable/Uncomplicated    Clinical Decision Making Low    Rehab Potential Good    PT Frequency 2x / week    PT Duration 4 weeks    PT Treatment/Interventions ADLs/Self Care Home Management;Canalith Repostioning;Biofeedback;Electrical Stimulation;Therapeutic activities;Functional mobility training;Stair training;Gait training;DME Instruction;Joint Manipulations;Vestibular;Visual/perceptual remediation/compensation;Dry needling;Spinal Manipulations;Patient/family education;Manual techniques;Splinting;Passive range of motion;Compression bandaging;Taping;Vasopneumatic Device;Therapeutic exercise;Balance training;Neuromuscular re-education    PT Next Visit Plan Review goals and HEP. Progress vestiblar exercises to include busy background, perform standing, add standing balance on foam, wiht head turns as tolerated.    PT Home Exercise Plan 12/22/19: saccades, VOR    Consulted and Agree with Plan of Care Patient           Patient will benefit from skilled therapeutic intervention in order to improve the following deficits and impairments:  Dizziness, Decreased balance, Decreased activity tolerance  Visit Diagnosis: Dizziness and giddiness     Problem List Patient  Active Problem List   Diagnosis Date Noted   Morbid obesity ([place]) 07/15/2014   Gastroesophageal reflux disease without esophagitis 02/14/2014   Essential hypertension, benign 11/09/2013   Hyperlipidemia 08/13/2013   Type 2 diabetes mellitus ([place]) 08/13/2013   Anxiety and depression 08/13/2013   NAFLD (nonalcoholic fatty liver disease) 04/23/2011   Obesity 04/23/2011   4:33 PM, 12/22/19 Josue Hector PT DPT  Physical Therapist with [facility]  [phone] [place], [place], [address] Phone: [phone]   Fax:  [phone]  Name: CELINE DISHMAN MRN: [id number] Date of Birth: Sep 21, 1965

## 2019-12-28 ENCOUNTER — Telehealth (HOSPITAL_COMMUNITY): Payer: Self-pay | Admitting: Physical Therapy

## 2019-12-28 NOTE — Telephone Encounter (Signed)
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

## 2019-12-29 ENCOUNTER — Ambulatory Visit (HOSPITAL_COMMUNITY): Payer: BC Managed Care – PPO | Admitting: Physical Therapy

## 2019-12-31 ENCOUNTER — Ambulatory Visit (HOSPITAL_COMMUNITY): Payer: BC Managed Care – PPO | Admitting: Physical Therapy

## 2020-01-01 ENCOUNTER — Encounter (INDEPENDENT_AMBULATORY_CARE_PROVIDER_SITE_OTHER): Payer: Self-pay | Admitting: Internal Medicine

## 2020-01-04 ENCOUNTER — Other Ambulatory Visit (INDEPENDENT_AMBULATORY_CARE_PROVIDER_SITE_OTHER): Payer: Self-pay | Admitting: Nurse Practitioner

## 2020-01-04 MED ORDER — TRULICITY 3 MG/0.5ML ~~LOC~~ SOAJ: 3.0000 mg | SUBCUTANEOUS | 1 refills | Status: DC

## 2020-01-04 NOTE — Telephone Encounter (Signed)
Please advise 

## 2020-01-07 ENCOUNTER — Ambulatory Visit (HOSPITAL_COMMUNITY): Payer: BC Managed Care – PPO | Admitting: Physical Therapy

## 2020-01-07 ENCOUNTER — Encounter (INDEPENDENT_AMBULATORY_CARE_PROVIDER_SITE_OTHER): Payer: Self-pay | Admitting: Internal Medicine

## 2020-01-12 ENCOUNTER — Other Ambulatory Visit: Payer: Self-pay

## 2020-01-12 ENCOUNTER — Ambulatory Visit (INDEPENDENT_AMBULATORY_CARE_PROVIDER_SITE_OTHER): Payer: BC Managed Care – PPO | Admitting: Internal Medicine

## 2020-01-12 ENCOUNTER — Encounter (INDEPENDENT_AMBULATORY_CARE_PROVIDER_SITE_OTHER): Payer: Self-pay | Admitting: Internal Medicine

## 2020-01-12 VITALS — BP 130/80 | HR 89 | Temp 97.5°F | Resp 18 | Ht 65.0 in | Wt 219.4 lb

## 2020-01-12 DIAGNOSIS — E1169 Type 2 diabetes mellitus with other specified complication: Secondary | ICD-10-CM

## 2020-01-12 DIAGNOSIS — R197 Diarrhea, unspecified: Secondary | ICD-10-CM

## 2020-01-12 DIAGNOSIS — I10 Essential (primary) hypertension: Secondary | ICD-10-CM | POA: Diagnosis not present

## 2020-01-12 MED ORDER — PROGESTERONE 200 MG PO CAPS: 400.0000 mg | ORAL_CAPSULE | Freq: Every day | ORAL | 1 refills | Status: DC

## 2020-01-12 MED ORDER — TRULICITY 3 MG/0.5ML ~~LOC~~ SOAJ
3.0000 mg | SUBCUTANEOUS | 3 refills | Status: DC
Start: 2020-01-12 — End: 2020-06-02

## 2020-01-12 MED ORDER — DICLOFENAC SODIUM 75 MG PO TBEC: 75.0000 mg | DELAYED_RELEASE_TABLET | Freq: Two times a day (BID) | ORAL | 2 refills | Status: DC

## 2020-01-12 MED ORDER — ATORVASTATIN CALCIUM 80 MG PO TABS
80.0000 mg | ORAL_TABLET | Freq: Every day | ORAL | 1 refills | Status: DC
Start: 2020-01-12 — End: 2020-03-27

## 2020-01-12 MED ORDER — HYDROCHLOROTHIAZIDE 25 MG PO TABS: 25.0000 mg | ORAL_TABLET | Freq: Every day | ORAL | 1 refills | Status: DC

## 2020-01-12 MED ORDER — LOSARTAN POTASSIUM 50 MG PO TABS: 50.0000 mg | ORAL_TABLET | Freq: Every day | ORAL | 1 refills | Status: DC

## 2020-01-12 MED ORDER — METFORMIN HCL ER 500 MG PO TB24: 1000.0000 mg | ORAL_TABLET | Freq: Two times a day (BID) | ORAL | 1 refills | Status: DC

## 2020-01-12 MED ORDER — ESTRADIOL 2 MG PO TABS
4.0000 mg | ORAL_TABLET | Freq: Every day | ORAL | 1 refills | Status: DC
Start: 2020-01-12 — End: 2021-01-03

## 2020-01-12 NOTE — Progress Notes (Signed)
Metrics: Intervention Frequency ACO  Documented Smoking Status Yearly  Screened one or more times in 24 months  Cessation Counseling or  Active cessation medication Past 24 months  Past 24 months   Guideline developer: UpToDate (See UpToDate for funding source) Date Released: 2014       Wellness Office Visit  Subjective:  Patient ID: Isabella Juarez, female    DOB: 26-Oct-1965  Age: 54 y.o. MRN: [id number]  CC: This lady comes in for follow-up of diabetes, obesity, diarrhea and hypertension. HPI  Lomotil seems to help her diarrhea and she is still awaiting an appointment with gastroenterology. She had problems with getting Trulicity refilled and she only started taking Trulicity again about a few days ago. She continues on hydrochlorothiazide, losartan for hypertension. She has managed to lose some weight since last time I saw her. She continues on estradiol and progesterone without any problems. Past Medical History:  Diagnosis Date  . Allergic rhinitis   . Anxiety   . Axillary mass 08/2012   left  . Dental crowns present   . Diabetes mellitus    NIDDM  . Diverticulosis of sigmoid colon 08/18/2012   and descending colon  . GERD (gastroesophageal reflux disease)    seldom  . Hemorrhoids   . High triglycerides   . Hypertension    under control with meds., has been on med. x 5-6 yr.  . Interstitial cystitis    has had no problems in 25 yr.  . NASH (nonalcoholic steatohepatitis)   . PCOS (polycystic ovarian syndrome)    Past Surgical History:  Procedure Laterality Date  . ABDOMINAL HYSTERECTOMY    . CESAREAN SECTION  08/02/1987, 12/15/1990  . CHOLECYSTECTOMY  01/05/2005   lap. chole.  Marland Kitchen COLONOSCOPY W/ POLYPECTOMY  08/18/2012  . LIVER BIOPSY  01/05/2005  . MASS EXCISION Left 09/04/2012   Procedure: Excision Left Axillary Mass 10 cm;  Surgeon: Haywood Lasso, MD;  Location: [place];  Service: General;  Laterality: Left;  . TOTAL ABDOMINAL HYSTERECTOMY W/  BILATERAL SALPINGOOPHORECTOMY  03/16/2009     Family History  Problem Relation Age of Onset  . Hypertension Mother   . Cancer Maternal Grandmother        breast    Social History   Social History Narrative   Daily caffeine    Married for 1yrs.   Social History   Tobacco Use  . Smoking status: Never Smoker  . Smokeless tobacco: Never Used  Substance Use Topics  . Alcohol use: No    Current Meds  Medication Sig  . atorvastatin (LIPITOR) 80 MG tablet Take 1 tablet (80 mg total) by mouth daily.  . Cholecalciferol (VITAMIN D-3) 125 MCG (5000 UT) TABS Take 10,000 Units by mouth daily at 12 noon.  . citalopram (CELEXA) 40 MG tablet Take 1 tablet by mouth once daily  . diphenoxylate-atropine (LOMOTIL) 2.5-0.025 MG tablet Take 1 tablet by mouth 4 (four) times daily.  . Dulaglutide (TRULICITY) 3 EN/2.7PO SOPN Inject 0.5 mLs (3 mg total) into the skin once a week.  . estradiol (ESTRACE) 2 MG tablet Take 2 tablets (4 mg total) by mouth daily.  . hydrochlorothiazide (HYDRODIURIL) 25 MG tablet Take 1 tablet (25 mg total) by mouth daily.  Marland Kitchen losartan (COZAAR) 50 MG tablet Take 1 tablet (50 mg total) by mouth daily.  . meclizine (ANTIVERT) 12.5 MG tablet Take 1 tablet (12.5 mg total) by mouth 3 (three) times daily as needed for dizziness.  . Melatonin 1  MG CAPS Take 1 capsule by mouth at bedtime.  . metFORMIN (GLUCOPHAGE-XR) 500 MG 24 hr tablet Take 2 tablets (1,000 mg total) by mouth 2 (two) times daily.  . methocarbamol (ROBAXIN) 500 MG tablet Take 500 mg by mouth at bedtime.  . NP THYROID 120 MG tablet TAKE 1 TABLET BY MOUTH BEFORE BREAKFAST  . ondansetron (ZOFRAN) 4 MG tablet Take 1 tablet (4 mg total) by mouth every 8 (eight) hours as needed for nausea or vomiting.  . pantoprazole (PROTONIX) 40 MG tablet Take 1 tablet (40 mg total) by mouth daily.  . potassium chloride SA (K-DUR) 20 MEQ tablet Take 40 mEq by mouth daily.  . Testosterone 20 % CREA Apply 5 mg topically daily.  .  Testosterone Propionate (FIRST-TESTOSTERONE MC) 2 % CREA Place 10 mg onto the skin daily.  Marland Kitchen tobramycin (TOBREX) 0.3 % ophthalmic solution Place 1 drop into both eyes every 4 (four) hours.  . vitamin B-12 (CYANOCOBALAMIN) 250 MCG tablet Take 250 mcg by mouth daily.  . vitamin C (ASCORBIC ACID) 500 MG tablet Take 500 mg by mouth daily.  Marland Kitchen zinc gluconate 50 MG tablet Take 50 mg by mouth daily.  . [DISCONTINUED] atorvastatin (LIPITOR) 80 MG tablet Take 1 tablet by mouth once daily  . [DISCONTINUED] Dulaglutide (TRULICITY) 3 HE/5.2DP SOPN Inject 0.5 mLs (3 mg total) into the skin once a week.  . [DISCONTINUED] estradiol (ESTRACE) 2 MG tablet Take 2 tablets (4 mg total) by mouth daily.  . [DISCONTINUED] hydrochlorothiazide (HYDRODIURIL) 25 MG tablet Take 1 tablet (25 mg total) by mouth daily.  . [DISCONTINUED] losartan (COZAAR) 50 MG tablet Take 1 tablet by mouth once daily  . [DISCONTINUED] metFORMIN (GLUCOPHAGE-XR) 500 MG 24 hr tablet Take 2 tablets by mouth twice daily  . [DISCONTINUED] progesterone (PROMETRIUM) 200 MG capsule Take 400 mg by mouth daily.   Current Facility-Administered Medications for the 01/12/20 encounter (Office Visit) with Doree Albee, MD  Medication  . diphenoxylate-atropine (LOMOTIL) 2.5-0.025 MG per tablet 1 tablet      No flowsheet data found.   Objective:   Today's Vitals: BP 130/80 (BP Location: Left Arm, Patient Position: Sitting, Cuff Size: Normal)   Pulse 89   Temp (!) 97.5 F (36.4 C) (Temporal)   Resp 18   Ht 5\' 5"  (1.651 m)   Wt 219 lb 6.4 oz (99.5 kg) Comment: 215lb @ home undressed.  SpO2 98%   BMI 36.51 kg/m  Vitals with BMI 01/12/2020 11/30/2019 11/02/2019  Height 5\' 5"  5\' 5"  5\' 5"   Weight 219 lbs 6 oz 222 lbs 6 oz 224 lbs  BMI 36.51 82.42 35.36  Systolic 144 315 400  Diastolic 80 80 90  Pulse 89 93 104     Physical Exam   She looks systemically well.  She has lost 3 pounds since last time I saw her.  Blood pressure well  controlled.    Assessment   1. Essential hypertension, benign   2. Diarrhea, unspecified type   3. Type 2 diabetes mellitus with other specified complication, without long-term current use of insulin ([place])   4. Morbid obesity ([place])       Tests ordered Orders Placed This Encounter  Procedures  . Ambulatory referral to Gastroenterology     Plan: 1. She will continue with losartan and hydrochlorothiazide for hypertension and I have refilled these medications today. 2. As far as her diarrhea is concerned, she will call gastroenterology in [place] that she went to before to get an  appointment. 3. She will continue with Metformin and Trulicity for her diabetes and try to eat better as before. 4. I will see her for follow-up in 2 months time and we will do the blood work then.   Meds ordered this encounter  Medications  . hydrochlorothiazide (HYDRODIURIL) 25 MG tablet    Sig: Take 1 tablet (25 mg total) by mouth daily.    Dispense:  90 tablet    Refill:  1  . atorvastatin (LIPITOR) 80 MG tablet    Sig: Take 1 tablet (80 mg total) by mouth daily.    Dispense:  90 tablet    Refill:  1  . losartan (COZAAR) 50 MG tablet    Sig: Take 1 tablet (50 mg total) by mouth daily.    Dispense:  90 tablet    Refill:  1  . Dulaglutide (TRULICITY) 3 ZY/2.4MG SOPN    Sig: Inject 0.5 mLs (3 mg total) into the skin once a week.    Dispense:  4 mL    Refill:  3  . estradiol (ESTRACE) 2 MG tablet    Sig: Take 2 tablets (4 mg total) by mouth daily.    Dispense:  180 tablet    Refill:  1    This is a new increased dose.  . progesterone (PROMETRIUM) 200 MG capsule    Sig: Take 2 capsules (400 mg total) by mouth daily.    Dispense:  180 capsule    Refill:  1  . metFORMIN (GLUCOPHAGE-XR) 500 MG 24 hr tablet    Sig: Take 2 tablets (1,000 mg total) by mouth 2 (two) times daily.    Dispense:  360 tablet    Refill:  1  . diclofenac (VOLTAREN) 75 MG EC tablet    Sig: Take 1 tablet (75 mg  total) by mouth 2 (two) times daily.    Dispense:  60 tablet    Refill:  2    Zakirah Weingart Luther Parody, MD

## 2020-01-13 ENCOUNTER — Encounter: Payer: Self-pay | Admitting: Gastroenterology

## 2020-01-15 ENCOUNTER — Telehealth (HOSPITAL_COMMUNITY): Payer: Self-pay | Admitting: Physical Therapy

## 2020-01-15 NOTE — Telephone Encounter (Signed)
She has to work and can not be here on this date

## 2020-01-18 ENCOUNTER — Encounter (HOSPITAL_COMMUNITY): Payer: BC Managed Care – PPO | Admitting: Physical Therapy

## 2020-01-20 ENCOUNTER — Encounter (HOSPITAL_COMMUNITY): Payer: BC Managed Care – PPO | Admitting: Physical Therapy

## 2020-01-26 ENCOUNTER — Encounter (HOSPITAL_COMMUNITY): Payer: BC Managed Care – PPO | Admitting: Physical Therapy

## 2020-01-26 ENCOUNTER — Telehealth (HOSPITAL_COMMUNITY): Payer: Self-pay | Admitting: Physical Therapy

## 2020-01-26 NOTE — Telephone Encounter (Signed)
She has to cx and will call back to r/s once she gets some work in the office.

## 2020-01-28 ENCOUNTER — Encounter (HOSPITAL_COMMUNITY): Payer: BC Managed Care – PPO | Admitting: Physical Therapy

## 2020-03-05 ENCOUNTER — Other Ambulatory Visit (INDEPENDENT_AMBULATORY_CARE_PROVIDER_SITE_OTHER): Payer: Self-pay | Admitting: Internal Medicine

## 2020-03-05 ENCOUNTER — Other Ambulatory Visit (INDEPENDENT_AMBULATORY_CARE_PROVIDER_SITE_OTHER): Payer: Self-pay | Admitting: Nurse Practitioner

## 2020-03-11 ENCOUNTER — Other Ambulatory Visit: Payer: Self-pay

## 2020-03-11 ENCOUNTER — Other Ambulatory Visit: Payer: Self-pay | Admitting: Obstetrics and Gynecology

## 2020-03-11 ENCOUNTER — Ambulatory Visit
Admission: RE | Admit: 2020-03-11 | Discharge: 2020-03-11 | Disposition: A | Discharge status: Home / Self Care | Payer: BC Managed Care – PPO | Source: Ambulatory Visit | Attending: Obstetrics and Gynecology | Admitting: Obstetrics and Gynecology

## 2020-03-11 DIAGNOSIS — N6489 Other specified disorders of breast: Secondary | ICD-10-CM

## 2020-03-15 ENCOUNTER — Ambulatory Visit (INDEPENDENT_AMBULATORY_CARE_PROVIDER_SITE_OTHER): Payer: BC Managed Care – PPO | Admitting: Gastroenterology

## 2020-03-15 ENCOUNTER — Ambulatory Visit (INDEPENDENT_AMBULATORY_CARE_PROVIDER_SITE_OTHER): Payer: BC Managed Care – PPO | Admitting: Internal Medicine

## 2020-03-15 ENCOUNTER — Other Ambulatory Visit (INDEPENDENT_AMBULATORY_CARE_PROVIDER_SITE_OTHER): Payer: BC Managed Care – PPO

## 2020-03-15 ENCOUNTER — Encounter: Payer: Self-pay | Admitting: Gastroenterology

## 2020-03-15 VITALS — BP 112/78 | HR 92 | Ht 63.5 in | Wt 211.1 lb

## 2020-03-15 DIAGNOSIS — K625 Hemorrhage of anus and rectum: Secondary | ICD-10-CM

## 2020-03-15 DIAGNOSIS — R112 Nausea with vomiting, unspecified: Secondary | ICD-10-CM

## 2020-03-15 DIAGNOSIS — K529 Noninfective gastroenteritis and colitis, unspecified: Secondary | ICD-10-CM

## 2020-03-15 LAB — SEDIMENTATION RATE: Sed Rate: 25 mm/hr (ref 0–30)

## 2020-03-15 LAB — C-REACTIVE PROTEIN: CRP: <1 mg/dL (ref 0.5–20.0)

## 2020-03-15 MED ORDER — NA SULFATE-K SULFATE-MG SULF 17.5-3.13-1.6 GM/177ML PO SOLN: 1.0000 | Freq: Once | ORAL | 0 refills | Status: AC

## 2020-03-15 MED ORDER — DICYCLOMINE HCL 10 MG PO CAPS: 20.0000 mg | ORAL_CAPSULE | Freq: Four times a day (QID) | ORAL | 2 refills | Status: AC

## 2020-03-15 NOTE — [facility] Notes (Signed)
Referring Provider: Doree Albee, MD Primary Care Physician:  Doree Albee, MD  Reason for Consultation:  Diarrhea   IMPRESSION:  Postprandial diarrhea and nausea Rectal bleeding with blood on the toilet paper every bowel movement  Differential includes post-Covid IBS, IBD, celiac disease, infection (such as giardia), food intolerance, microscopic colitis, thyroid disorder, other functional GI disease, medication or supplement side effect all of which may be occurring with concurrent outlet bleeding. By history, this is less likely to be obstruction.   I was unable to review the content of Isabella Juarez [employer] bars because [facility] blocks access to their website.    PLAN: Food diary Counseling to avoid carbonated beverages, artificial sweeteners, diary Dicyclomine 20 mg QID taken 30-60 minutes prior to meals ESR, CRP, fecal calprotectin, TSH GI pathogen panel, Giardia EGD with biopsies and Colonoscopy with biopsies  Please see the "Patient Instructions" section for addition details about the plan.  HPI: Isabella Juarez is a 54 y.o. female referred by Dr. Anastasio Champion for stomach issues. The history is obtained through the patient.   She had Covid in October 2020. Only GI symptom with acute Covid was some mild diarrhea. Now experiencing long term dizziness. Developed reflux symptoms soon there after that have been adequately controlled on daily pantoprazole x 1 year.   Presents today with concerns for: 20 BM daily x 3 months, these are largely post-prandial and associated with nausea. She has nocturnal symptoms that awake Isabella Juarez from sleep but not soiling. She has had several accidents during the daytime. Some mucous in the stools. Sees red in the stool but attributes this to tomatoes. Blood on the toilet paper after wiping.  Some weight loss due to the diarrhea.  No change with Imodium.  Asking about Optavia bars. Eats four of those daily and wonders if they are related to Isabella Juarez symptoms.    Also wonders if some of Isabella Juarez hormonal therapies may be contributing.   [place] water. Drinks a lot of bottled water. No recent travel or exposure to stream or well water.  Stopped drinking carbonated beverages due to associated bloating.  Not eating beef. No identified triggers with mammaliam meat. No recent antibiotics. No known sick exposures.  Screening colonoscopy with Dr. Sharlett Iles 08/18/12: not a "polyp" polyp was removed showing only normal mucosa, diverticulosis, internal hemorrhoids  Father with constipation . No known family history of colon cancer or polyps. No family history of uterine/endometrial cancer, pancreatic cancer or gastric/stomach cancer.   Past Medical History:  Diagnosis Date  . Allergic rhinitis   . Anxiety   . Axillary mass 08/2012   left  . Colon polyps   . Dental crowns present   . Diabetes mellitus    NIDDM  . Diverticulosis of sigmoid colon 08/18/2012   and descending colon  . Gallstones   . GERD (gastroesophageal reflux disease)    seldom  . Hemorrhoids   . High triglycerides   . Hypertension    under control with meds., has been on med. x 5-6 yr.  . IBS (irritable bowel syndrome)   . Interstitial cystitis    has had no problems in 25 yr.  . NASH (nonalcoholic steatohepatitis)   . PCOS (polycystic ovarian syndrome)   . Pneumonia     Past Surgical History:  Procedure Laterality Date  . CESAREAN SECTION  08/02/1987, 12/15/1990  . CHOLECYSTECTOMY  01/05/2005   lap. chole.  Marland Kitchen COLONOSCOPY W/ POLYPECTOMY  08/18/2012  . LIVER BIOPSY  01/05/2005  .  MASS EXCISION Left 09/04/2012   Procedure: Excision Left Axillary Mass 10 cm;  Surgeon: Haywood Lasso, MD;  Location: [facility];  Service: General;  Laterality: Left;  . TOTAL ABDOMINAL HYSTERECTOMY W/ BILATERAL SALPINGOOPHORECTOMY  03/16/2009    Current Outpatient Medications  Medication Sig Dispense Refill  . atorvastatin (LIPITOR) 80 MG tablet Take 1 tablet (80 mg total) by  mouth daily. 90 tablet 1  . Cholecalciferol (VITAMIN D-3) 125 MCG (5000 UT) TABS Take 5,000 Units by mouth in the morning and at bedtime.     . citalopram (CELEXA) 40 MG tablet Take 1 tablet by mouth once daily (Patient taking differently: Take 20 mg by mouth daily. ) 30 tablet 0  . diclofenac (VOLTAREN) 75 MG EC tablet Take 1 tablet (75 mg total) by mouth 2 (two) times daily. (Patient taking differently: Take 75 mg by mouth as needed. ) 60 tablet 2  . Dulaglutide (TRULICITY) 3 YH/0.6CB SOPN Inject 0.5 mLs (3 mg total) into the skin once a week. 4 mL 3  . estradiol (ESTRACE) 2 MG tablet Take 2 tablets (4 mg total) by mouth daily. (Patient taking differently: Take 2 mg by mouth daily. ) 180 tablet 1  . hydrochlorothiazide (HYDRODIURIL) 25 MG tablet Take 1 tablet (25 mg total) by mouth daily. 90 tablet 1  . losartan (COZAAR) 50 MG tablet Take 1 tablet (50 mg total) by mouth daily. 90 tablet 1  . meclizine (ANTIVERT) 12.5 MG tablet Take 1 tablet (12.5 mg total) by mouth 3 (three) times daily as needed for dizziness. (Patient taking differently: Take 12.5 mg by mouth as needed for dizziness. ) 30 tablet 2  . Melatonin 1 MG CAPS Take 1 capsule by mouth at bedtime.    . metFORMIN (GLUCOPHAGE-XR) 500 MG 24 hr tablet Take 2 tablets (1,000 mg total) by mouth 2 (two) times daily. 360 tablet 1  . NP THYROID 120 MG tablet TAKE 1 TABLET BY MOUTH BEFORE BREAKFAST 30 tablet 3  . ondansetron (ZOFRAN) 4 MG tablet Take 1 tablet (4 mg total) by mouth every 8 (eight) hours as needed for nausea or vomiting. (Patient taking differently: Take 4 mg by mouth as needed for nausea or vomiting. ) 30 tablet 0  . pantoprazole (PROTONIX) 40 MG tablet Take 1 tablet (40 mg total) by mouth daily. 90 tablet 1  . potassium chloride SA (KLOR-CON) 20 MEQ tablet Take 2 tablets by mouth once daily 180 tablet 0  . progesterone (PROMETRIUM) 200 MG capsule Take 2 capsules (400 mg total) by mouth daily. (Patient taking differently: Take 200 mg  by mouth daily. ) 180 capsule 1  . Testosterone Propionate (FIRST-TESTOSTERONE MC) 2 % CREA Place 10 mg onto the skin daily. 30 g 0  . vitamin B-12 (CYANOCOBALAMIN) 250 MCG tablet Take 250 mcg by mouth daily.    . vitamin C (ASCORBIC ACID) 500 MG tablet Take 500 mg by mouth daily.     Current Facility-Administered Medications  Medication Dose Route Frequency Provider Last Rate Last Admin  . diphenoxylate-atropine (LOMOTIL) 2.5-0.025 MG per tablet 1 tablet  1 tablet Oral QID PRN Doree Albee, MD        Allergies as of 03/15/2020 - Review Complete 03/15/2020  Allergen Reaction Noted  . Dyazide [hydrochlorothiazide w-triamterene]  09/17/2012  . Penicillins  09/17/2012  . Tanzeum [albiglutide] Other (See Comments) 11/09/2013  . Vasotec [enalapril] Cough 09/17/2012    Family History  Problem Relation Age of Onset  . Hypertension Mother   .  Hyperlipidemia Mother   . Other Mother        breast masses  . Kidney disease Father   . Hearing loss Father   . Breast cancer Maternal Grandmother   . Stroke Maternal Grandmother   . Hearing loss Paternal Grandmother   . COPD Paternal Grandfather   . Hearing loss Paternal Uncle   . Heart disease Paternal Uncle     Social History   Socioeconomic History  . Marital status: Married    Spouse name: Not on file  . Number of children: 2  . Years of education: Not on file  . Highest education level: Not asked  Occupational History  . Occupation: Producer, television/film/video: [employer]  Tobacco Use  . Smoking status: Never Smoker  . Smokeless tobacco: Never Used  Vaping Use  . Vaping Use: Never used  Substance and Sexual Activity  . Alcohol use: No  . Drug use: No  . Sexual activity: Yes    Partners: Male  Other Topics Concern  . Not on file  Social History Narrative   Daily caffeine    Married for 34yr.   Social Determinants of Health   Financial Resource Strain:   . Difficulty of Paying Living Expenses: Not on  file  Food Insecurity:   . Worried About RCharity fundraiserin the Last Year: Not on file  . Ran Out of Food in the Last Year: Not on file  Transportation Needs:   . Lack of Transportation (Medical): Not on file  . Lack of Transportation (Non-Medical): Not on file  Physical Activity:   . Days of Exercise per Week: Not on file  . Minutes of Exercise per Session: Not on file  Stress:   . Feeling of Stress : Not on file  Social Connections:   . Frequency of Communication with Friends and Family: Not on file  . Frequency of Social Gatherings with Friends and Family: Not on file  . Attends Religious Services: Not on file  . Active Member of Clubs or Organizations: Not on file  . Attends CArchivistMeetings: Not on file  . Marital Status: Not on file  Intimate Partner Violence:   . Fear of Current or Ex-Partner: Not on file  . Emotionally Abused: Not on file  . Physically Abused: Not on file  . Sexually Abused: Not on file    Review of Systems: 12 system ROS is negative except as noted above with the additions of allergies, breast changes, vision changes, fatigue, headaches, hearing problems, night sweats, nose bleeds, insomnia, and urinary frequency.   Physical Exam: General:   Alert,  well-nourished, pleasant and cooperative in NAD Head:  Normocephalic and atraumatic. Eyes:  Sclera clear, no icterus.   Conjunctiva pink. Ears:  Normal auditory acuity. Nose:  No deformity, discharge,  or lesions. Mouth:  No deformity or lesions.   Neck:  Supple; no masses or thyromegaly. Lungs:  Clear throughout to auscultation.   No wheezes. Heart:  Regular rate and rhythm; no murmurs. Abdomen:  Soft, I am unable to reproduce Isabella Juarez pain, nondistended, normal bowel sounds, no rebound or guarding. No hepatosplenomegaly.   Rectal:  Deferred  Msk:  Symmetrical. No boney deformities LAD: No inguinal or umbilical LAD Extremities:  No clubbing or edema. Neurologic:  Alert and  oriented x4;   grossly nonfocal Skin:  Intact without significant lesions or rashes. Psych:  Alert and cooperative. Normal mood and affect.    Zeferino Mounts L.  Tarri Glenn, MD, MPH 03/15/2020, 3:48 PM

## 2020-03-15 NOTE — Patient Instructions (Addendum)
If you are age 55 or younger, your body mass index should be between 19-25. Your Body mass index is 36.81 kg/m. If this is out of the aformentioned range listed, please consider follow up with your Primary Care Provider.   LABS: I have recommended labs and stool studies to evaluate for inflammation and infection. Please proceed to the basement level for lab work before leaving today. Press "B" on the elevator. The lab is located at the first door on the left as you exit the elevator.  HEALTHCARE LAWS AND MY CHART RESULTS: Due to recent changes in healthcare laws, you may see the results of your imaging and laboratory studies on MyChart before your provider has had a chance to review them.  We understand that in some cases there may be results that are confusing or concerning to you. Not all laboratory results come back in the same time frame and the provider may be waiting for multiple results in order to interpret others.  Please give [place] 48 hours in order for your provider to thoroughly review all the results before contacting the office for clarification of your results.   I have also recommended an EGD and colonoscopy.   COLONOSCOPY: You have been scheduled for a colonoscopy. Please follow written instructions given to you at your visit today.   PREP: Please pick up your prep supplies at the pharmacy within the next 1-3 days.  INHALERS: If you use inhalers (even only as needed), please bring them with you on the day of your procedure.  PRESCRIPTION MEDICATION(S): We have sent the following medication(s) to your pharmacy:  . Dicyclomine 20 mg taken 30 minutes prior to meals.   In the meantime, let's try to identify a dietary trigger. Please keep a symptoms and stool diarrhea until we see each other again.  During that time I would try the following (do only one change at a time)       - No carbonated beverages       - No artificial sweeteners       - Full lactose free trial: 3 weeks NO,  milk, cheese, sour cream,ice cream, yogurt, creamer, baked goods (cookies/cakes/watch breads), chocolate (even dark chocolate), protein bars, dressing/condiments (watch). You won't live like this forever but for the trial need to be strict.  Thank you for trusting me with your gastrointestinal care!    Thornton Park, MD, MPH

## 2020-03-16 ENCOUNTER — Other Ambulatory Visit (INDEPENDENT_AMBULATORY_CARE_PROVIDER_SITE_OTHER): Payer: Self-pay | Admitting: Internal Medicine

## 2020-03-16 ENCOUNTER — Encounter (INDEPENDENT_AMBULATORY_CARE_PROVIDER_SITE_OTHER): Payer: Self-pay | Admitting: Internal Medicine

## 2020-03-16 DIAGNOSIS — N390 Urinary tract infection, site not specified: Secondary | ICD-10-CM

## 2020-03-16 LAB — TSH: TSH: 0.5 u[IU]/mL (ref 0.35–4.50)

## 2020-03-16 MED ORDER — CIPROFLOXACIN HCL 500 MG PO TABS: 500.0000 mg | ORAL_TABLET | Freq: Two times a day (BID) | ORAL | 0 refills | Status: DC

## 2020-03-17 ENCOUNTER — Encounter: Payer: Self-pay | Admitting: Gastroenterology

## 2020-03-26 ENCOUNTER — Other Ambulatory Visit (INDEPENDENT_AMBULATORY_CARE_PROVIDER_SITE_OTHER): Payer: Self-pay | Admitting: Internal Medicine

## 2020-04-04 ENCOUNTER — Other Ambulatory Visit: Payer: Self-pay

## 2020-04-04 ENCOUNTER — Ambulatory Visit (INDEPENDENT_AMBULATORY_CARE_PROVIDER_SITE_OTHER): Payer: BC Managed Care – PPO | Admitting: Internal Medicine

## 2020-04-04 ENCOUNTER — Encounter (INDEPENDENT_AMBULATORY_CARE_PROVIDER_SITE_OTHER): Payer: Self-pay | Admitting: Internal Medicine

## 2020-04-04 VITALS — BP 134/84 | HR 79 | Temp 96.9°F | Ht 63.5 in | Wt 209.2 lb

## 2020-04-04 DIAGNOSIS — I10 Essential (primary) hypertension: Secondary | ICD-10-CM | POA: Diagnosis not present

## 2020-04-04 DIAGNOSIS — R350 Frequency of micturition: Secondary | ICD-10-CM

## 2020-04-04 DIAGNOSIS — E2839 Other primary ovarian failure: Secondary | ICD-10-CM

## 2020-04-04 DIAGNOSIS — E1169 Type 2 diabetes mellitus with other specified complication: Secondary | ICD-10-CM | POA: Diagnosis not present

## 2020-04-04 MED ORDER — NP THYROID 30 MG PO TABS: 30.0000 mg | ORAL_TABLET | Freq: Every day | ORAL | 1 refills | Status: DC

## 2020-04-04 NOTE — [facility] Notes (Signed)
Metrics: Intervention Frequency ACO  Documented Smoking Status Yearly  Screened one or more times in 24 months  Cessation Counseling or  Active cessation medication Past 24 months  Past 24 months   Guideline developer: UpToDate (See UpToDate for funding source) Date Released: 2014       Wellness Office Visit  Subjective:  Patient ID: Isabella Juarez, female    DOB: 1965/06/15  Age: 54 y.o. MRN: [id number]  CC: This lady comes in for follow-up of hypertension, diabetes, obesity, PCOS. HPI  She is describing more recently urinary frequency without significant incontinence.  In the past, she has been diagnosed with interstitial cystitis. She continues on desiccated NP thyroid, off label, for symptoms of thyroid deficiency. She continues on Trulicity and Metformin for diabetes. She continues on losartan and hydrochlorthiazide for hypertension. She continues on atorvastatin for hyperlipidemia in the face of diabetes. Past Medical History:  Diagnosis Date  . Allergic rhinitis   . Anxiety   . Axillary mass 08/2012   left  . Colon polyps   . Dental crowns present   . Diabetes mellitus    NIDDM  . Diverticulosis of sigmoid colon 08/18/2012   and descending colon  . Gallstones   . GERD (gastroesophageal reflux disease)    seldom  . Hemorrhoids   . High triglycerides   . Hypertension    under control with meds., has been on med. x 5-6 yr.  . IBS (irritable bowel syndrome)   . Interstitial cystitis    has had no problems in 25 yr.  . NASH (nonalcoholic steatohepatitis)   . PCOS (polycystic ovarian syndrome)   . Pneumonia    Past Surgical History:  Procedure Laterality Date  . CESAREAN SECTION  08/02/1987, 12/15/1990  . CHOLECYSTECTOMY  01/05/2005   lap. chole.  Marland Kitchen COLONOSCOPY W/ POLYPECTOMY  08/18/2012  . LIVER BIOPSY  01/05/2005  . MASS EXCISION Left 09/04/2012   Procedure: Excision Left Axillary Mass 10 cm;  Surgeon: Haywood Lasso, MD;  Location: [facility];   Service: General;  Laterality: Left;  . TOTAL ABDOMINAL HYSTERECTOMY W/ BILATERAL SALPINGOOPHORECTOMY  03/16/2009     Family History  Problem Relation Age of Onset  . Hypertension Mother   . Hyperlipidemia Mother   . Other Mother        breast masses  . Kidney disease Father   . Hearing loss Father   . Breast cancer Maternal Grandmother   . Stroke Maternal Grandmother   . Hearing loss Paternal Grandmother   . COPD Paternal Grandfather   . Hearing loss Paternal Uncle   . Heart disease Paternal Uncle     Social History   Social History Narrative   Daily caffeine    Married for 64yrs.   Social History   Tobacco Use  . Smoking status: Never Smoker  . Smokeless tobacco: Never Used  Substance Use Topics  . Alcohol use: No    Current Meds  Medication Sig  . atorvastatin (LIPITOR) 80 MG tablet Take 1 tablet by mouth once daily  . Cholecalciferol (VITAMIN D-3) 125 MCG (5000 UT) TABS Take 5,000 Units by mouth in the morning and at bedtime.   . citalopram (CELEXA) 40 MG tablet Take 1 tablet by mouth once daily (Patient taking differently: Take 20 mg by mouth daily. )  . diclofenac (VOLTAREN) 75 MG EC tablet Take 1 tablet (75 mg total) by mouth 2 (two) times daily. (Patient taking differently: Take 75 mg by mouth as needed. )  .  dicyclomine (BENTYL) 10 MG capsule Take 2 capsules (20 mg total) by mouth 4 (four) times daily. Take 30-60 minutes prior to meals  . Dulaglutide (TRULICITY) 3 DU/2.0UR SOPN Inject 0.5 mLs (3 mg total) into the skin once a week.  . estradiol (ESTRACE) 2 MG tablet Take 2 tablets (4 mg total) by mouth daily. (Patient taking differently: Take 2 mg by mouth daily. )  . hydrochlorothiazide (HYDRODIURIL) 25 MG tablet Take 1 tablet (25 mg total) by mouth daily.  Marland Kitchen losartan (COZAAR) 50 MG tablet Take 1 tablet (50 mg total) by mouth daily.  . meclizine (ANTIVERT) 12.5 MG tablet Take 1 tablet (12.5 mg total) by mouth 3 (three) times daily as needed for dizziness.  (Patient taking differently: Take 12.5 mg by mouth as needed for dizziness. )  . Melatonin 1 MG CAPS Take 1 capsule by mouth at bedtime.  . metFORMIN (GLUCOPHAGE-XR) 500 MG 24 hr tablet Take 2 tablets (1,000 mg total) by mouth 2 (two) times daily.  . NP THYROID 120 MG tablet TAKE 1 TABLET BY MOUTH BEFORE BREAKFAST  . ondansetron (ZOFRAN) 4 MG tablet Take 1 tablet (4 mg total) by mouth every 8 (eight) hours as needed for nausea or vomiting. (Patient taking differently: Take 4 mg by mouth as needed for nausea or vomiting. )  . pantoprazole (PROTONIX) 40 MG tablet Take 1 tablet (40 mg total) by mouth daily.  . potassium chloride SA (KLOR-CON) 20 MEQ tablet Take 2 tablets by mouth once daily  . progesterone (PROMETRIUM) 200 MG capsule Take 2 capsules (400 mg total) by mouth daily. (Patient taking differently: Take 200 mg by mouth daily. )  . vitamin B-12 (CYANOCOBALAMIN) 250 MCG tablet Take 250 mcg by mouth daily.  . vitamin C (ASCORBIC ACID) 500 MG tablet Take 500 mg by mouth daily.  . [DISCONTINUED] Testosterone Propionate (FIRST-TESTOSTERONE MC) 2 % CREA Place 10 mg onto the skin daily.   Current Facility-Administered Medications for the 04/04/20 encounter (Office Visit) with Doree Albee, MD  Medication  . diphenoxylate-atropine (LOMOTIL) 2.5-0.025 MG per tablet 1 tablet      No flowsheet data found.   Objective:   Today's Vitals: BP 134/84   Pulse 79   Temp (!) 96.9 F (36.1 C) (Temporal)   Ht 5' 3.5" (1.613 m)   Wt 209 lb 3.2 oz (94.9 kg)   SpO2 97%   BMI 36.48 kg/m  Vitals with BMI 04/04/2020 03/15/2020 01/12/2020  Height 5' 3.5" 5' 3.5" 5\' 5"   Weight 209 lbs 3 oz 211 lbs 2 oz 219 lbs 6 oz  BMI 36.47 42.70 62.37  Systolic 628 315 176  Diastolic 84 78 80  Pulse 79 92 89     Physical Exam  She has lost 2 pounds since the last appointment and remains morbidly obese.  Blood pressure acceptable today.     Assessment   1. Essential hypertension, benign   2. Type 2  diabetes mellitus with other specified complication, without long-term current use of insulin ([place])   3. Morbid obesity ([place])   4. Female hypogonadism syndrome   5. Urinary frequency       Tests ordered Orders Placed This Encounter  Procedures  . COMPLETE METABOLIC PANEL WITH GFR  . Hemoglobin A1c  . Lipid panel  . Ambulatory referral to Urology     Plan: 1. She will continue with antihypertensive therapy as before and we will check renal function. 2. She will continue with Trulicity and Metformin for diabetes and  we will check an A1c. 3. I think we could further optimize NP thyroid as this will help with insulin resistance and symptoms of thyroid hypofunction.  I have added NP thyroid 30 mg tablet at lunchtime.  She will take this in addition to the NP thyroid 120 mg in the morning. 4. I will refer to urology for her urinary symptoms. 5. Follow-up in 3 months for an annual physical exam   Meds ordered this encounter  Medications  . NP THYROID 30 MG tablet    Sig: Take 1 tablet (30 mg total) by mouth daily before breakfast.    Dispense:  90 tablet    Refill:  1    Fara Worthy Luther Parody, MD

## 2020-04-06 ENCOUNTER — Encounter (INDEPENDENT_AMBULATORY_CARE_PROVIDER_SITE_OTHER): Payer: Self-pay | Admitting: Internal Medicine

## 2020-04-06 LAB — LIPID PANEL
Cholesterol: 128 mg/dL (ref <200)
HDL: 35 mg/dL — ABNORMAL LOW (ref >=50)
LDL Cholesterol (Calc): 72 mg/dL (calc)
Non-HDL Cholesterol (Calc): 93 mg/dL (calc) (ref <130)
Total CHOL/HDL Ratio: 3.7 (calc) (ref <5.0)
Triglycerides: 127 mg/dL (ref <150)

## 2020-04-06 LAB — HEMOGLOBIN A1C
Hgb A1c MFr Bld: 5.6 % of total Hgb (ref <5.7)
Mean Plasma Glucose: 114 (calc)
eAG (mmol/L): 6.3 (calc)

## 2020-04-06 LAB — COMPLETE METABOLIC PANEL WITH GFR
AG Ratio: 1.9 (calc) (ref 1.0–2.5)
ALT: 26 U/L (ref 6–29)
AST: 23 U/L (ref 10–35)
Albumin: 4 g/dL (ref 3.6–5.1)
Alkaline phosphatase (APISO): 63 U/L (ref 37–153)
BUN: 13 mg/dL (ref 7–25)
CO2: 28 mmol/L (ref 20–32)
Calcium: 9.4 mg/dL (ref 8.6–10.4)
Chloride: 105 mmol/L (ref 98–110)
Creat: 0.68 mg/dL (ref 0.50–1.05)
GFR, Est African American: 114 mL/min/{1.73_m2} (ref >=60)
GFR, Est Non African American: 98 mL/min/{1.73_m2} (ref >=60)
Globulin: 2.1 g/dL (calc) (ref 1.9–3.7)
Glucose, Bld: 84 mg/dL (ref 65–99)
Potassium: 3.8 mmol/L (ref 3.5–5.3)
Sodium: 141 mmol/L (ref 135–146)
Total Bilirubin: 0.4 mg/dL (ref 0.2–1.2)
Total Protein: 6.1 g/dL (ref 6.1–8.1)

## 2020-04-11 ENCOUNTER — Encounter: Payer: Self-pay | Admitting: Urology

## 2020-04-11 ENCOUNTER — Other Ambulatory Visit: Payer: Self-pay

## 2020-04-11 ENCOUNTER — Ambulatory Visit (INDEPENDENT_AMBULATORY_CARE_PROVIDER_SITE_OTHER): Payer: BC Managed Care – PPO | Admitting: Urology

## 2020-04-11 VITALS — BP 119/74 | HR 98 | Temp 98.4°F | Ht 63.5 in | Wt 209.2 lb

## 2020-04-11 DIAGNOSIS — R350 Frequency of micturition: Secondary | ICD-10-CM

## 2020-04-11 DIAGNOSIS — N3010 Interstitial cystitis (chronic) without hematuria: Secondary | ICD-10-CM

## 2020-04-11 LAB — MICROSCOPIC EXAMINATION: RBC, Urine: NONE SEEN /hpf (ref 0–2)

## 2020-04-11 LAB — URINALYSIS, ROUTINE W REFLEX MICROSCOPIC
Bilirubin, UA: NEGATIVE
Glucose, UA: NEGATIVE
Ketones, UA: NEGATIVE
Leukocytes,UA: NEGATIVE
Nitrite, UA: POSITIVE — AB
Protein,UA: NEGATIVE
Specific Gravity, UA: 1.025 (ref 1.005–1.030)
Urobilinogen, Ur: 0.2 mg/dL (ref 0.2–1.0)
pH, UA: 5.5 (ref 5.0–7.5)

## 2020-04-11 MED ORDER — TRAMADOL HCL 50 MG PO TABS: 50.0000 mg | ORAL_TABLET | Freq: Four times a day (QID) | ORAL | 0 refills | Status: DC | PRN

## 2020-04-11 MED ORDER — SULFAMETHOXAZOLE-TRIMETHOPRIM 800-160 MG PO TABS: 1.0000 | ORAL_TABLET | Freq: Two times a day (BID) | ORAL | 0 refills | Status: DC

## 2020-04-11 NOTE — [facility] Notes (Signed)
Urological Symptom Review  Patient is experiencing the following symptoms: Frequent urination Burning/pain with urination Get up at night to urinate Urinary tract infection Painful intercourse   Review of Systems  Gastrointestinal (upper)  : Negative for upper GI symptoms  Gastrointestinal (lower) : Diarrhea Constipation  Constitutional : Fatigue  Skin: Negative for skin symptoms  Eyes: Negative for eye symptoms  Ear/Nose/Throat : Negative for Ear/Nose/Throat symptoms  Hematologic/Lymphatic: Negative for Hematologic/Lymphatic symptoms  Cardiovascular : Negative for cardiovascular symptoms  Respiratory : Negative for respiratory symptoms  Endocrine: Negative for endocrine symptoms  Musculoskeletal: Negative for musculoskeletal symptoms  Neurological: Negative for neurological symptoms  Psychologic: Negative for psychiatric symptoms

## 2020-04-11 NOTE — Progress Notes (Signed)
04/11/2020 2:29 PM   Isabella Juarez [birth year] [id number]  Referring provider: Doree Albee, MD [address] [place],  [place] [address]  Urinary frequency  HPI: Ms Lowder is a 54yo here for evaluation of urinary frequency and suprapubic pressure. Starting 1 month ago she had suprapubic pressure and frequency, urgency and dysuria. She was treated on cipro which failed to improve her LUTS. No hx of nephrolithiasis.    PMH: Past Medical History:  Diagnosis Date  . Allergic rhinitis   . Anxiety   . Axillary mass 08/2012   left  . Colon polyps   . Dental crowns present   . Diabetes mellitus    NIDDM  . Diverticulosis of sigmoid colon 08/18/2012   and descending colon  . Gallstones   . GERD (gastroesophageal reflux disease)    seldom  . Hemorrhoids   . High triglycerides   . Hypertension    under control with meds., has been on med. x 5-6 yr.  . IBS (irritable bowel syndrome)   . Interstitial cystitis    has had no problems in 25 yr.  . NASH (nonalcoholic steatohepatitis)   . PCOS (polycystic ovarian syndrome)   . Pneumonia     Surgical History: Past Surgical History:  Procedure Laterality Date  . CESAREAN SECTION  08/02/1987, 12/15/1990  . CHOLECYSTECTOMY  01/05/2005   lap. chole.  Mar[facility] Kitchen COLONOSCOPY W/ POLYPECTOMY  08/18/2012  . LIVER BIOPSY  01/05/2005  . MASS EXCISION Left 09/04/2012   Procedure: Excision Left Axillary Mass 10 cm;  Surgeon: Haywood Lasso, MD;  Location: [facility];  Service: General;  Laterality: Left;  . TOTAL ABDOMINAL HYSTERECTOMY W/ BILATERAL SALPINGOOPHORECTOMY  03/16/2009    Home Medications:  Allergies as of 04/11/2020      Reactions   Dyazide [hydrochlorothiazide W-triamterene]    Fatigue    Penicillins    Tanzeum [albiglutide] Other (See Comments)   Pharyngitis; burning in the mouth   Vasotec [enalapril] Cough      Medication List       Accurate as of April 11, 2020  2:29 PM. If you have any questions, ask  your nurse or doctor.        atorvastatin 80 MG tablet Commonly known as: LIPITOR Take 1 tablet by mouth once daily   citalopram 40 MG tablet Commonly known as: CELEXA Take 1 tablet by mouth once daily What changed: how much to take   diclofenac 75 MG EC tablet Commonly known as: VOLTAREN Take 1 tablet (75 mg total) by mouth 2 (two) times daily.   dicyclomine 10 MG capsule Commonly known as: BENTYL Take 2 capsules (20 mg total) by mouth 4 (four) times daily. Take 30-60 minutes prior to meals   estradiol 2 MG tablet Commonly known as: ESTRACE Take 2 tablets (4 mg total) by mouth daily. What changed: how much to take   hydrochlorothiazide 25 MG tablet Commonly known as: HYDRODIURIL Take 1 tablet (25 mg total) by mouth daily.   losartan 50 MG tablet Commonly known as: COZAAR Take 1 tablet (50 mg total) by mouth daily.   meclizine 12.5 MG tablet Commonly known as: ANTIVERT Take 1 tablet (12.5 mg total) by mouth 3 (three) times daily as needed for dizziness. What changed: when to take this   Melatonin 1 MG Caps Take 1 capsule by mouth at bedtime.   metFORMIN 500 MG 24 hr tablet Commonly known as: GLUCOPHAGE-XR Take 2 tablets (1,000 mg total) by mouth 2 (  two) times daily.   NP Thyroid 120 MG tablet Generic drug: thyroid TAKE 1 TABLET BY MOUTH BEFORE BREAKFAST   NP Thyroid 30 MG tablet Generic drug: thyroid Take 1 tablet (30 mg total) by mouth daily before breakfast.   ondansetron 4 MG tablet Commonly known as: Zofran Take 1 tablet (4 mg total) by mouth every 8 (eight) hours as needed for nausea or vomiting. What changed: when to take this   pantoprazole 40 MG tablet Commonly known as: PROTONIX Take 1 tablet (40 mg total) by mouth daily.   potassium chloride SA 20 MEQ tablet Commonly known as: KLOR-CON Take 2 tablets by mouth once daily   progesterone 200 MG capsule Commonly known as: PROMETRIUM Take 2 capsules (400 mg total) by mouth daily. What  changed: how much to take   Suprep Bowel Prep Kit 17.5-3.13-1.6 GM/177ML Soln Generic drug: Na Sulfate-K Sulfate-Mg Sulf Take by mouth.   Trulicity 3 WS/5.6CL Sopn Generic drug: Dulaglutide Inject 0.5 mLs (3 mg total) into the skin once a week.   vitamin B-12 250 MCG tablet Commonly known as: CYANOCOBALAMIN Take 250 mcg by mouth daily.   vitamin C 500 MG tablet Commonly known as: ASCORBIC ACID Take 500 mg by mouth daily.   Vitamin D-3 125 MCG (5000 UT) Tabs Take 5,000 Units by mouth in the morning and at bedtime.       Allergies:  Allergies  Allergen Reactions  . Dyazide [Hydrochlorothiazide W-Triamterene]     Fatigue   . Penicillins   . Tanzeum [Albiglutide] Other (See Comments)    Pharyngitis; burning in the mouth  . Vasotec [Enalapril] Cough    Family History: Family History  Problem Relation Age of Onset  . Hypertension Mother   . Hyperlipidemia Mother   . Other Mother        breast masses  . Kidney disease Father   . Hearing loss Father   . Breast cancer Maternal Grandmother   . Stroke Maternal Grandmother   . Hearing loss Paternal Grandmother   . COPD Paternal Grandfather   . Hearing loss Paternal Uncle   . Heart disease Paternal Uncle     Social History:  reports that she has never smoked. She has never used smokeless tobacco. She reports that she does not drink alcohol and does not use drugs.  ROS: All other review of systems were reviewed and are negative except what is noted above in HPI  Physical Exam: BP 119/74   Pulse 98   Temp 98.4 F (36.9 C)   Ht 5' 3.5" (1.613 m)   Wt 209 lb 3.2 oz (94.9 kg)   BMI 36.48 kg/m   Constitutional:  Alert and oriented, No acute distress. HEENT: [place] AT, moist mucus membranes.  Trachea midline, no masses. Cardiovascular: No clubbing, cyanosis, or edema. Respiratory: Normal respiratory effort, no increased work of breathing. GI: Abdomen is soft, nontender, nondistended, no abdominal masses GU: No CVA  tenderness.  Lymph: No cervical or inguinal lymphadenopathy. Skin: No rashes, bruises or suspicious lesions. Neurologic: Grossly intact, no focal deficits, moving all 4 extremities. Psychiatric: Normal mood and affect.  Laboratory Data: Lab Results  Component Value Date   WBC 7.4 01/28/2019   HGB 14.4 01/28/2019   HCT 41.7 01/28/2019   MCV 83.4 01/28/2019   PLT 289 01/28/2019    Lab Results  Component Value Date   CREATININE 0.68 04/04/2020    No results found for: PSA  No results found for: TESTOSTERONE  Lab Results  Component Value Date   HGBA1C 5.6 04/04/2020    Urinalysis    Component Value Date/Time   NITRITE positive 10/21/2012 1124   LEUKOCYTESUR large (3+) 10/21/2012 1124    No results found for: LABMICR, WBCUA, RBCUA, LABEPIT, MUCUS, BACTERIA  Pertinent Imaging:  No results found for this or any previous visit.  No results found for this or any previous visit.  No results found for this or any previous visit.  No results found for this or any previous visit.  No results found for this or any previous visit.  No results found for this or any previous visit.  No results found for this or any previous visit.  No results found for this or any previous visit.   Assessment & Plan:    1. Urinary frequency -Urine for culture, if her urine culture is negative and her LUTS fail to improve we will obtaina  CT stone study - BLADDER SCAN AMB NON-IMAGING - Urinalysis, Routine w reflex microscopic   No follow-ups on file.  Nicolette Bang, MD  [facility] [place]

## 2020-04-12 ENCOUNTER — Other Ambulatory Visit (INDEPENDENT_AMBULATORY_CARE_PROVIDER_SITE_OTHER): Payer: Self-pay | Admitting: Internal Medicine

## 2020-04-13 ENCOUNTER — Encounter: Payer: Self-pay | Admitting: Urology

## 2020-04-13 NOTE — Patient Instructions (Signed)

## 2020-04-19 ENCOUNTER — Encounter: Payer: Self-pay | Admitting: Urology

## 2020-04-19 ENCOUNTER — Other Ambulatory Visit: Payer: Self-pay

## 2020-04-19 ENCOUNTER — Ambulatory Visit (INDEPENDENT_AMBULATORY_CARE_PROVIDER_SITE_OTHER): Payer: BC Managed Care – PPO | Admitting: Urology

## 2020-04-19 VITALS — BP 122/73 | HR 81 | Temp 98.2°F | Ht 63.5 in | Wt 209.2 lb

## 2020-04-19 DIAGNOSIS — N3281 Overactive bladder: Secondary | ICD-10-CM | POA: Diagnosis not present

## 2020-04-19 LAB — URINALYSIS, ROUTINE W REFLEX MICROSCOPIC
Bilirubin, UA: NEGATIVE
Glucose, UA: NEGATIVE
Leukocytes,UA: NEGATIVE
Nitrite, UA: NEGATIVE
Protein,UA: NEGATIVE
RBC, UA: NEGATIVE
Specific Gravity, UA: 1.025 (ref 1.005–1.030)
Urobilinogen, Ur: 1 mg/dL (ref 0.2–1.0)
pH, UA: 6.5 (ref 5.0–7.5)

## 2020-04-19 NOTE — Patient Instructions (Signed)

## 2020-04-19 NOTE — Progress Notes (Signed)
04/19/2020 4:15 PM   Isabella Juarez 02-08-1966 [id number]  Referring provider: Doree Albee, MD [address] [place],  [place] [address]  Followup overactive bladder  HPI: Ms Ketterman is a 54yo here for followup for OAB and pelvic pain. She notes since starting the mirabegron 7m her suprapubic pressure and pain have resolved. Urinary urgency improved and she denies any urge incontinence. Nocturia resolved. No dysuria or hematuria   PMH: Past Medical History:  Diagnosis Date  . Allergic rhinitis   . Anxiety   . Axillary mass 08/2012   left  . Colon polyps   . Dental crowns present   . Diabetes mellitus    NIDDM  . Diverticulosis of sigmoid colon 08/18/2012   and descending colon  . Gallstones   . GERD (gastroesophageal reflux disease)    seldom  . Hemorrhoids   . High triglycerides   . Hypertension    under control with meds., has been on med. x 5-6 yr.  . IBS (irritable bowel syndrome)   . Interstitial cystitis    has had no problems in 25 yr.  . NASH (nonalcoholic steatohepatitis)   . PCOS (polycystic ovarian syndrome)   . Pneumonia     Surgical History: Past Surgical History:  Procedure Laterality Date  . CESAREAN SECTION  08/02/1987, 12/15/1990  . CHOLECYSTECTOMY  01/05/2005   lap. chole.  .Marland KitchenCOLONOSCOPY W/ POLYPECTOMY  08/18/2012  . LIVER BIOPSY  01/05/2005  . MASS EXCISION Left 09/04/2012   Procedure: Excision Left Axillary Mass 10 cm;  Surgeon: CHaywood Lasso MD;  [facility]  Service: General;  Laterality: Left;  . TOTAL ABDOMINAL HYSTERECTOMY W/ BILATERAL SALPINGOOPHORECTOMY  03/16/2009    Home Medications:  Allergies as of 04/19/2020      Reactions   Dyazide [hydrochlorothiazide W-triamterene]    Fatigue    Penicillins    Tanzeum [albiglutide] Other (See Comments)   Pharyngitis; burning in the mouth   Vasotec [enalapril] Cough      Medication List       Accurate as of April 19, 2020  4:15 PM. If you have any  questions, ask your nurse or doctor.        atorvastatin 80 MG tablet Commonly known as: LIPITOR Take 1 tablet by mouth once daily   citalopram 40 MG tablet Commonly known as: CELEXA Take 1 tablet by mouth once daily What changed: how much to take   diclofenac 75 MG EC tablet Commonly known as: VOLTAREN Take 1 tablet (75 mg total) by mouth 2 (two) times daily.   dicyclomine 10 MG capsule Commonly known as: BENTYL Take 2 capsules (20 mg total) by mouth 4 (four) times daily. Take 30-60 minutes prior to meals   estradiol 2 MG tablet Commonly known as: ESTRACE Take 2 tablets (4 mg total) by mouth daily. What changed: how much to take   fluconazole 150 MG tablet Commonly known as: DIFLUCAN Take 1 tablet by mouth once daily   hydrochlorothiazide 25 MG tablet Commonly known as: HYDRODIURIL Take 1 tablet (25 mg total) by mouth daily.   losartan 50 MG tablet Commonly known as: COZAAR Take 1 tablet (50 mg total) by mouth daily.   meclizine 12.5 MG tablet Commonly known as: ANTIVERT Take 1 tablet (12.5 mg total) by mouth 3 (three) times daily as needed for dizziness. What changed: when to take this   Melatonin 1 MG Caps Take 1 capsule by mouth at bedtime.   metFORMIN 500  MG 24 hr tablet Commonly known as: GLUCOPHAGE-XR Take 2 tablets (1,000 mg total) by mouth 2 (two) times daily.   NP Thyroid 120 MG tablet Generic drug: thyroid TAKE 1 TABLET BY MOUTH BEFORE BREAKFAST   NP Thyroid 30 MG tablet Generic drug: thyroid Take 1 tablet (30 mg total) by mouth daily before breakfast.   ondansetron 4 MG tablet Commonly known as: Zofran Take 1 tablet (4 mg total) by mouth every 8 (eight) hours as needed for nausea or vomiting. What changed: when to take this   pantoprazole 40 MG tablet Commonly known as: PROTONIX Take 1 tablet (40 mg total) by mouth daily.   potassium chloride SA 20 MEQ tablet Commonly known as: KLOR-CON Take 2 tablets by mouth once daily     progesterone 200 MG capsule Commonly known as: PROMETRIUM Take 2 capsules (400 mg total) by mouth daily. What changed: how much to take   sulfamethoxazole-trimethoprim 800-160 MG tablet Commonly known as: BACTRIM DS Take 1 tablet by mouth every 12 (twelve) hours.   Suprep Bowel Prep Kit 17.5-3.13-1.6 GM/177ML Soln Generic drug: Na Sulfate-K Sulfate-Mg Sulf Take by mouth.   traMADol 50 MG tablet Commonly known as: Ultram Take 1 tablet (50 mg total) by mouth every 6 (six) hours as needed.   Trulicity 3 TL/5.7WI Sopn Generic drug: Dulaglutide Inject 0.5 mLs (3 mg total) into the skin once a week.   vitamin B-12 250 MCG tablet Commonly known as: CYANOCOBALAMIN Take 250 mcg by mouth daily.   vitamin C 500 MG tablet Commonly known as: ASCORBIC ACID Take 500 mg by mouth daily.   Vitamin D-3 125 MCG (5000 UT) Tabs Take 5,000 Units by mouth in the morning and at bedtime.       Allergies:  Allergies  Allergen Reactions  . Dyazide [Hydrochlorothiazide W-Triamterene]     Fatigue   . Penicillins   . Tanzeum [Albiglutide] Other (See Comments)    Pharyngitis; burning in the mouth  . Vasotec [Enalapril] Cough    Family History: Family History  Problem Relation Age of Onset  . Hypertension Mother   . Hyperlipidemia Mother   . Other Mother        breast masses  . Kidney disease Father   . Hearing loss Father   . Breast cancer Maternal Grandmother   . Stroke Maternal Grandmother   . Hearing loss Paternal Grandmother   . COPD Paternal Grandfather   . Hearing loss Paternal Uncle   . Heart disease Paternal Uncle     Social History:  reports that she has never smoked. She has never used smokeless tobacco. She reports that she does not drink alcohol and does not use drugs.  ROS: All other review of systems were reviewed and are negative except what is noted above in HPI  Physical Exam: BP 122/73   Pulse 81   Temp 98.2 F (36.8 C)   Ht 5' 3.5" (1.613 m)   Wt 209 lb  3.2 oz (94.9 kg)   BMI 36.48 kg/m   Constitutional:  Alert and oriented, No acute distress. HEENT: [place] AT, moist mucus membranes.  Trachea midline, no masses. Cardiovascular: No clubbing, cyanosis, or edema. Respiratory: Normal respiratory effort, no increased work of breathing. GI: Abdomen is soft, nontender, nondistended, no abdominal masses GU: No CVA tenderness.  Lymph: No cervical or inguinal lymphadenopathy. Skin: No rashes, bruises or suspicious lesions. Neurologic: Grossly intact, no focal deficits, moving all 4 extremities. Psychiatric: Normal mood and affect.  Laboratory Data: Lab  Results  Component Value Date   WBC 7.4 01/28/2019   HGB 14.4 01/28/2019   HCT 41.7 01/28/2019   MCV 83.4 01/28/2019   PLT 289 01/28/2019    Lab Results  Component Value Date   CREATININE 0.68 04/04/2020    No results found for: PSA  No results found for: TESTOSTERONE  Lab Results  Component Value Date   HGBA1C 5.6 04/04/2020    Urinalysis    Component Value Date/Time   APPEARANCEUR Cloudy (A) 04/11/2020 1347   GLUCOSEU Negative 04/11/2020 1347   BILIRUBINUR Negative 04/11/2020 1347   PROTEINUR Negative 04/11/2020 1347   NITRITE Positive (A) 04/11/2020 1347   LEUKOCYTESUR Negative 04/11/2020 1347    Lab Results  Component Value Date   LABMICR See below: 04/11/2020   WBCUA 0-5 04/11/2020   LABEPIT 0-10 04/11/2020   BACTERIA Few (A) 04/11/2020    Pertinent Imaging:  No results found for this or any previous visit.  No results found for this or any previous visit.  No results found for this or any previous visit.  No results found for this or any previous visit.  No results found for this or any previous visit.  No results found for this or any previous visit.  No results found for this or any previous visit.  No results found for this or any previous visit.   Assessment & Plan:    1. OAB -We will continue mirabegron 55m daily -RTC 4 weeks - Urinalysis,  Routine w reflex microscopic   No follow-ups on file.  PNicolette Bang MD  [facility] [place]

## 2020-04-19 NOTE — Progress Notes (Signed)
Urological Symptom Review  Patient is experiencing the following symptoms: Frequent urination Get up at night to urinate   Review of Systems  Gastrointestinal (upper)  : Indigestion/heartburn  Gastrointestinal (lower) : Diarrhea Constipation  Constitutional : Fatigue  Skin: Negative for skin symptoms  Eyes: Negative for eye symptoms  Ear/Nose/Throat : Negative for Ear/Nose/Throat symptoms  Hematologic/Lymphatic: Negative for Hematologic/Lymphatic symptoms  Cardiovascular : Negative for cardiovascular symptoms  Respiratory : Negative for respiratory symptoms  Endocrine: Negative for endocrine symptoms  Musculoskeletal: Negative for musculoskeletal symptoms  Neurological: Negative for neurological symptoms  Psychologic: Negative for psychiatric symptoms

## 2020-04-28 ENCOUNTER — Other Ambulatory Visit: Payer: BC Managed Care – PPO

## 2020-04-28 DIAGNOSIS — K625 Hemorrhage of anus and rectum: Secondary | ICD-10-CM

## 2020-04-28 DIAGNOSIS — R112 Nausea with vomiting, unspecified: Secondary | ICD-10-CM

## 2020-04-28 DIAGNOSIS — K529 Noninfective gastroenteritis and colitis, unspecified: Secondary | ICD-10-CM

## 2020-04-29 ENCOUNTER — Ambulatory Visit (AMBULATORY_SURGERY_CENTER): Payer: BC Managed Care – PPO | Admitting: Gastroenterology

## 2020-04-29 ENCOUNTER — Encounter: Payer: Self-pay | Admitting: Gastroenterology

## 2020-04-29 ENCOUNTER — Other Ambulatory Visit: Payer: Self-pay

## 2020-04-29 VITALS — BP 137/80 | HR 88 | Temp 96.2°F | Resp 15 | Ht 63.0 in | Wt 211.0 lb

## 2020-04-29 DIAGNOSIS — K5730 Diverticulosis of large intestine without perforation or abscess without bleeding: Secondary | ICD-10-CM | POA: Diagnosis not present

## 2020-04-29 DIAGNOSIS — K621 Rectal polyp: Secondary | ICD-10-CM | POA: Diagnosis not present

## 2020-04-29 DIAGNOSIS — K529 Noninfective gastroenteritis and colitis, unspecified: Secondary | ICD-10-CM

## 2020-04-29 DIAGNOSIS — D123 Benign neoplasm of transverse colon: Secondary | ICD-10-CM

## 2020-04-29 DIAGNOSIS — R197 Diarrhea, unspecified: Secondary | ICD-10-CM

## 2020-04-29 DIAGNOSIS — K259 Gastric ulcer, unspecified as acute or chronic, without hemorrhage or perforation: Secondary | ICD-10-CM | POA: Diagnosis not present

## 2020-04-29 DIAGNOSIS — K319 Disease of stomach and duodenum, unspecified: Secondary | ICD-10-CM | POA: Diagnosis not present

## 2020-04-29 DIAGNOSIS — K635 Polyp of colon: Secondary | ICD-10-CM | POA: Diagnosis not present

## 2020-04-29 DIAGNOSIS — D128 Benign neoplasm of rectum: Secondary | ICD-10-CM

## 2020-04-29 DIAGNOSIS — K2980 Duodenitis without bleeding: Secondary | ICD-10-CM | POA: Diagnosis not present

## 2020-04-29 DIAGNOSIS — K2970 Gastritis, unspecified, without bleeding: Secondary | ICD-10-CM

## 2020-04-29 DIAGNOSIS — R112 Nausea with vomiting, unspecified: Secondary | ICD-10-CM

## 2020-04-29 LAB — GIARDIA AND CRYPTOSPORIDIUM ANTIGEN PANEL
MICRO NUMBER:: 11297803
RESULT:: NOT DETECTED
SPECIMEN QUALITY:: ADEQUATE
Specimen Quality:: ADEQUATE
micro Number:: 11297802

## 2020-04-29 MED ORDER — HYDROCORTISONE (PERIANAL) 2.5 % EX CREA: 1.0000 "application " | TOPICAL_CREAM | Freq: Two times a day (BID) | CUTANEOUS | 1 refills | Status: DC

## 2020-04-29 MED ORDER — SODIUM CHLORIDE 0.9 % IV SOLN: 500.0000 mL | Freq: Once | INTRAVENOUS | Status: DC

## 2020-04-29 NOTE — Op Note (Signed)
[facility] Patient Name: Isabella Juarez Procedure Date: 04/29/2020 3:17 PM MRN: [id number] Endoscopist: Thornton Park MD, MD Age: 54 Referring MD:  Date of Birth: 1965-07-25 Gender: Female Account #: [id number] Procedure:                Upper GI endoscopy Indications:              Diarrhea, Nausea Medicines:                Monitored Anesthesia Care Procedure:                Pre-Anesthesia Assessment:                           - Prior to the procedure, a History and Physical                            was performed, and patient medications and                            allergies were reviewed. The patient's tolerance of                            previous anesthesia was also reviewed. The risks                            and benefits of the procedure and the sedation                            options and risks were discussed with the patient.                            All questions were answered, and informed consent                            was obtained. Prior Anticoagulants: The patient has                            taken no previous anticoagulant or antiplatelet                            agents. ASA Grade Assessment: II - A patient with                            mild systemic disease. After reviewing the risks                            and benefits, the patient was deemed in                            satisfactory condition to undergo the procedure.                           After obtaining informed consent, the endoscope was  passed under direct vision. Throughout the                            procedure, the patient's blood pressure, pulse, and                            oxygen saturations were monitored continuously. The                            Endoscope was introduced through the mouth, and                            advanced to the third part of duodenum. The upper                            GI endoscopy was accomplished without  difficulty.                            The patient tolerated the procedure well. Scope In: Scope Out: Findings:                 The esophagus was normal.                           Bilious fluid was found in the gastric body.                           Diffuse mild inflammation characterized by                            erythema, friability and granularity was found in                            the gastric body. Biopsies were taken from the                            antrum, body, and fundus with a cold forceps for                            histology. Estimated blood loss was minimal.                           The examined duodenum was normal. Biopsies were                            taken with a cold forceps for histology. Estimated                            blood loss was minimal. Complications:            No immediate complications. Estimated blood loss:                            Minimal. Estimated Blood Loss:     Estimated blood loss was minimal. Impression:               -  Normal esophagus.                           - Bilious gastric fluid.                           - Gastritis. Biopsied.                           - Normal examined duodenum. Biopsied. Recommendation:           - Patient has a contact number available for                            emergencies. The signs and symptoms of potential                            delayed complications were discussed with the                            patient. Return to normal activities tomorrow.                            Written discharge instructions were provided to the                            patient.                           - Resume previous diet.                           - Continue present medications.                           - No aspirin, ibuprofen, naproxen, or other                            non-steroidal anti-inflammatory drugs.                           - Await pathology results.                           -  Proceed with colonoscopy as previously planned. Thornton Park MD, MD 04/29/2020 3:55:21 PM This report has been signed electronically.

## 2020-04-29 NOTE — Progress Notes (Signed)
Report to PACU, RN, vss, BBS= Clear.  

## 2020-04-29 NOTE — Patient Instructions (Signed)
Handouts given for Gastritis, hemorrhoids, diverticulosis, high fiber diet and polyps,   No NSAIDS (Non-Steroidal anti-inflammatory drugs) .  (These include, aspirin, aspirin-containing products, ibuprofen, advil, motrin, naproxen, aleve, goody powders, etc) Tylenol is ok to take as needed, see label for instructions.  Pick up Rx for Anusol from [facility].    YOU HAD AN ENDOSCOPIC PROCEDURE TODAY AT [facility]:   Refer to the procedure report that was given to you for any specific questions about what was found during the examination.  If the procedure report does not answer your questions, please call your gastroenterologist to clarify.  If you requested that your care partner not be given the details of your procedure findings, then the procedure report has been included in a sealed envelope for you to review at your convenience later.  YOU SHOULD EXPECT: Some feelings of bloating in the abdomen. Passage of more gas than usual.  Walking can help get rid of the air that was put into your GI tract during the procedure and reduce the bloating. If you had a lower endoscopy (such as a colonoscopy or flexible sigmoidoscopy) you may notice spotting of blood in your stool or on the toilet paper. If you underwent a bowel prep for your procedure, you may not have a normal bowel movement for a few days.  Please Note:  You might notice some irritation and congestion in your nose or some drainage.  This is from the oxygen used during your procedure.  There is no need for concern and it should clear up in a day or so.  SYMPTOMS TO REPORT IMMEDIATELY:   Following lower endoscopy (colonoscopy or flexible sigmoidoscopy):  Excessive amounts of blood in the stool  Significant tenderness or worsening of abdominal pains  Swelling of the abdomen that is new, acute  Fever of 100F or higher   Following upper endoscopy (EGD)  Vomiting of blood or coffee ground material  New chest pain  or pain under the shoulder blades  Painful or persistently difficult swallowing  New shortness of breath  Fever of 100F or higher  Black, tarry-looking stools  For urgent or emergent issues, a gastroenterologist can be reached at any hour by calling [phone]. Do not use MyChart messaging for urgent concerns.    DIET:  We do recommend a small meal at first, but then you may proceed to your regular diet.  Drink plenty of fluids but you should avoid alcoholic beverages for 24 hours.  ACTIVITY:  You should plan to take it easy for the rest of today and you should NOT DRIVE or use heavy machinery until tomorrow (because of the sedation medicines used during the test).    FOLLOW UP: Our staff will call the number listed on your records 48-72 hours following your procedure to check on you and address any questions or concerns that you may have regarding the information given to you following your procedure. If we do not reach you, we will leave a message.  We will attempt to reach you two times.  During this call, we will ask if you have developed any symptoms of COVID 19. If you develop any symptoms (ie: fever, flu-like symptoms, shortness of breath, cough etc.) before then, please call [phone].  If you test positive for Covid 19 in the 2 weeks post procedure, please call and report this information to [place].    If any biopsies were taken you will be contacted by phone or by letter  within the next 1-3 weeks.  Please call us at [phone] if you have not heard about the biopsies in 3 weeks.    SIGNATURES/CONFIDENTIALITY: You and/or your care partner have signed paperwork which will be entered into your electronic medical record.  These signatures attest to the fact that that the information above on your After Visit Summary has been reviewed and is understood.  Full responsibility of the confidentiality of this discharge information lies with you and/or your care-partner.

## 2020-04-29 NOTE — Progress Notes (Signed)
Called to room to assist during endoscopic procedure.  Patient ID and intended procedure confirmed with present staff. Received instructions for my participation in the procedure from the performing physician.  

## 2020-04-29 NOTE — Op Note (Signed)
[facility] Patient Name: Isabella Juarez Procedure Date: 04/29/2020 3:27 PM MRN: [id number] Endoscopist: Thornton Park MD, MD Age: 54 Referring MD:  Date of Birth: 1966-02-04 Gender: Female Account #: [id number] Procedure:                Colonoscopy Indications:              Rectal bleeding                           Screening colonoscopy with Dr. Sharlett Iles 08/18/12:                            not a "polyp" polyp was removed showing only normal                            mucosa                           No known family history of colon cancer or polyps Medicines:                Monitored Anesthesia Care Procedure:                Pre-Anesthesia Assessment:                           - Prior to the procedure, a History and Physical                            was performed, and patient medications and                            allergies were reviewed. The patient's tolerance of                            previous anesthesia was also reviewed. The risks                            and benefits of the procedure and the sedation                            options and risks were discussed with the patient.                            All questions were answered, and informed consent                            was obtained. Prior Anticoagulants: The patient has                            taken no previous anticoagulant or antiplatelet                            agents. ASA Grade Assessment: II - A patient with  mild systemic disease. After reviewing the risks                            and benefits, the patient was deemed in                            satisfactory condition to undergo the procedure.                           After obtaining informed consent, the colonoscope                            was passed under direct vision. Throughout the                            procedure, the patient's blood pressure, pulse, and                            oxygen  saturations were monitored continuously. The                            Colonoscope was introduced through the anus and                            advanced to the 3 cm into the ileum. The                            colonoscopy was performed without difficulty. The                            patient tolerated the procedure well. The quality                            of the bowel preparation was good. The terminal                            ileum, ileocecal valve, appendiceal orifice, and                            rectum were photographed. Scope In: 3:28:31 PM Scope Out: 3:47:25 PM Scope Withdrawal Time: 0 hours 16 minutes 0 seconds  Total Procedure Duration: 0 hours 18 minutes 54 seconds  Findings:                 Non-bleeding internal hemorrhoids were found.                           Two flat polyps were found in the rectum and                            hepatic flexure. The polyps were 1 to 2 mm in size.                            These polyps were removed with a cold  biopsy                            forceps. Resection and retrieval were complete.                            Estimated blood loss was minimal.                           Many small-mouthed diverticula were found in the                            sigmoid colon and descending colon.                           The exam was otherwise without abnormality on                            direct and retroflexion views. Complications:            No immediate complications. Estimated blood loss:                            Minimal. Estimated Blood Loss:     Estimated blood loss was minimal. Impression:               - Non-bleeding internal hemorrhoids.                           - Two 1 to 2 mm polyps in the rectum and at the                            hepatic flexure, removed with a cold biopsy                            forceps. Resected and retrieved.                           - The examination was otherwise normal on direct                             and retroflexion views. Recommendation:           - Patient has a contact number available for                            emergencies. The signs and symptoms of potential                            delayed complications were discussed with the                            patient. Return to normal activities tomorrow.                            Written discharge instructions were provided to the  patient.                           - Resume previous diet.                           - Continue present medications. Add a daily a stool                            bulking agent such as psyllium or methylcellulose.                           - Await pathology results.                           - Repeat colonoscopy date to be determined after                            pending pathology results are reviewed for                            surveillance.                           - Start using Anusol HC 2.5% applied sparingly to                            your rectum twice daily.                           - If you would like more information,                            MyGIHealth.com and UpToDate.com have good                            information about hemorrhoids.                           - Emerging evidence supports eating a diet of                            fruits, vegetables, grains, calcium, and yogurt                            while reducing red meat and alcohol may reduce the                            risk of colon cancer. Thornton Park MD, MD 04/29/2020 4:02:50 PM This report has been signed electronically.

## 2020-05-02 LAB — GI PROFILE, STOOL, PCR

## 2020-05-03 ENCOUNTER — Telehealth: Payer: Self-pay | Admitting: *Deleted

## 2020-05-03 LAB — CALPROTECTIN: Calprotectin: 40 mcg/g

## 2020-05-03 NOTE — Telephone Encounter (Signed)
1. Have you developed a fever since your procedure? no  2.   Have you had an respiratory symptoms (SOB or cough) since your procedure? no  3.   Have you tested positive for COVID 19 since your procedure no  4.   Have you had any family members/close contacts diagnosed with the COVID 19 since your procedure?  no   If yes to any of these questions please route to Joylene John, RN and Joella Prince, RN    Follow up Call-  Call back number 04/29/2020  Post procedure Call Back phone  # [phone]  Permission to leave phone message Yes  Some recent data might be hidden     Patient questions:  Do you have a fever, pain , or abdominal swelling? No. Pain Score  0 *  Have you tolerated food without any problems? Yes.    Have you been able to return to your normal activities? Yes.    Do you have any questions about your discharge instructions: Diet   No. Medications  No. Follow up visit  No.  Do you have questions or concerns about your Care? No.  Actions: * If pain score is 4 or above: No action needed, pain <4.

## 2020-05-10 ENCOUNTER — Encounter: Payer: Self-pay | Admitting: Gastroenterology

## 2020-05-10 ENCOUNTER — Telehealth (INDEPENDENT_AMBULATORY_CARE_PROVIDER_SITE_OTHER): Payer: Self-pay

## 2020-05-10 ENCOUNTER — Other Ambulatory Visit: Payer: Self-pay

## 2020-05-10 MED ORDER — PANTOPRAZOLE SODIUM 40 MG PO TBEC: 40.0000 mg | DELAYED_RELEASE_TABLET | Freq: Two times a day (BID) | ORAL | 3 refills | Status: AC

## 2020-05-10 NOTE — Telephone Encounter (Signed)
Pt called wanting you to know she has taking melatonin 10 mg; it is not working. She is up til 1-2 am not getting any sleep. She is really stressful about not getting a good night sleep. Want to see if you would give something for this . She stated she had Ambien before.

## 2020-05-10 NOTE — Telephone Encounter (Signed)
Where did she get the melatonin from?

## 2020-05-11 NOTE — Telephone Encounter (Signed)
It was at [facility] not the mail order brand we prefer.

## 2020-05-12 ENCOUNTER — Encounter (INDEPENDENT_AMBULATORY_CARE_PROVIDER_SITE_OTHER): Payer: Self-pay | Admitting: Internal Medicine

## 2020-05-17 ENCOUNTER — Ambulatory Visit: Payer: BC Managed Care – PPO | Admitting: Urology

## 2020-05-18 ENCOUNTER — Ambulatory Visit: Payer: BC Managed Care – PPO | Admitting: Urology

## 2020-06-02 ENCOUNTER — Other Ambulatory Visit (INDEPENDENT_AMBULATORY_CARE_PROVIDER_SITE_OTHER): Payer: Self-pay | Admitting: Nurse Practitioner

## 2020-06-02 ENCOUNTER — Encounter: Payer: Self-pay | Admitting: Urology

## 2020-06-02 ENCOUNTER — Other Ambulatory Visit (INDEPENDENT_AMBULATORY_CARE_PROVIDER_SITE_OTHER): Payer: Self-pay | Admitting: Internal Medicine

## 2020-06-02 ENCOUNTER — Other Ambulatory Visit: Payer: Self-pay

## 2020-06-02 ENCOUNTER — Telehealth: Payer: Self-pay | Admitting: Gastroenterology

## 2020-06-02 ENCOUNTER — Ambulatory Visit (INDEPENDENT_AMBULATORY_CARE_PROVIDER_SITE_OTHER): Payer: BC Managed Care – PPO | Admitting: Urology

## 2020-06-02 DIAGNOSIS — N3281 Overactive bladder: Secondary | ICD-10-CM

## 2020-06-02 DIAGNOSIS — K6289 Other specified diseases of anus and rectum: Secondary | ICD-10-CM

## 2020-06-02 DIAGNOSIS — K5900 Constipation, unspecified: Secondary | ICD-10-CM

## 2020-06-02 LAB — URINALYSIS, ROUTINE W REFLEX MICROSCOPIC
Bilirubin, UA: NEGATIVE
Glucose, UA: NEGATIVE
Ketones, UA: NEGATIVE
Leukocytes,UA: NEGATIVE
Nitrite, UA: NEGATIVE
Protein,UA: NEGATIVE
RBC, UA: NEGATIVE
Specific Gravity, UA: 1.025 (ref 1.005–1.030)
Urobilinogen, Ur: 0.2 mg/dL (ref 0.2–1.0)
pH, UA: 5 (ref 5.0–7.5)

## 2020-06-02 MED ORDER — MIRABEGRON ER 50 MG PO TB24: 50.0000 mg | ORAL_TABLET | Freq: Every day | ORAL | 11 refills | Status: DC

## 2020-06-02 MED ORDER — TRULICITY 3 MG/0.5ML ~~LOC~~ SOAJ
3.0000 mg | SUBCUTANEOUS | 3 refills | Status: DC
Start: 2020-06-02 — End: 2020-10-22

## 2020-06-02 NOTE — Progress Notes (Signed)
Urological Symptom Review  Patient is experiencing the following symptoms: Frequent urination Get up at night to urinate   Review of Systems  Gastrointestinal (upper)  : Indigestion/heartburn  Gastrointestinal (lower) : Constipation  Constitutional : Fatigue  Skin: Negative for skin symptoms  Eyes: Negative for eye symptoms  Ear/Nose/Throat : Negative for Ear/Nose/Throat symptoms  Hematologic/Lymphatic: Negative for Hematologic/Lymphatic symptoms  Cardiovascular : Negative for cardiovascular symptoms  Respiratory : Negative for respiratory symptoms  Endocrine: Negative for endocrine symptoms  Musculoskeletal: Negative for musculoskeletal symptoms  Neurological: Negative for neurological symptoms  Psychologic: Negative for psychiatric symptoms

## 2020-06-02 NOTE — Telephone Encounter (Signed)
Called pt to inquire further about her request for Linzess, and about her symptoms. States she has noticed a change in her bowels over the past week. She has been having to strain to defecate, may have a BM qod, stools are very hard and formed, resulting in slight rectal bleeding with hygiene and rectal pain. Denies rectal tissue prolapse. Has been using Anusol for her rectal pain with noticeable relief. Denies visualizing blood in her stool or in the toilet after defecation. Further denies abd pain or N/V. She is c/o increased gas and bloating and states she has recently started taking OTC stool softeners as needed night. Denies use of any other OTC products to relieve her constipation and denies use of fiber supplements. Medication list has been reconciled with pt and is currently up to date as of the time of this call.   Further inquired about her request to refill Linzess. States she received a sample from Dr. Sharlett Iles in 2014. Advised I will forward her request and her symptoms along to Dr. Hilarie Fredrickson for further orders and advice. Provided following nursing considerations:    increase fluid intake  may try warmed prune juice  recommended taking her stool softeners daily rather than prn  May also try a dose of Miralax to alleviate her immediate concern until a response has been received from Dr. Hilarie Fredrickson.  Continue Anusol for rectal pain relief  No future follow up appts noted.  LOV with Dr. Tarri Glenn 03/15/20:  PLAN: Food diary Counseling to avoid carbonated beverages, artificial sweeteners, diary Dicyclomine 20 mg QID taken 30-60 minutes prior to meals ESR, CRP, fecal calprotectin, TSH GI pathogen panel, Giardia EGD with biopsies and Colonoscopy with biopsies  Last colonoscopy and EGD 04/29/20 with following letter sent to pt re: path:  The biopsies that were taken of your stomach showed gastritis, an inflammation of the stomach lining and gastric erosions. The biopsies from your  duodenum also showed some changes related to acid.  Thankfully there are no worrisome findings.  This is good news.  The gastritis is best treated by avoiding nonsteroidal anti-inflammatory medications (NSAIDs) such as ibuprofen, Aleve, Advil, Naprosyn, Voltaren as well as controlling stomach acid with a medication such as a your pantoprazole. Please increase your pantoprazole to 40 mg twice daily.   One of the polyps removed during your colonoscopy was a precancerous polyp known as a sessile serrated polyp. The other polyp was a benign hyperplastic polyp.  There was no cancer.  Given these results you should have another colonoscopy in 5 years to make sure that you do not develop additional polyps.

## 2020-06-02 NOTE — Telephone Encounter (Signed)
Inbound call from patient stating she is currently having issues going to the restroom; has taken Linzess in the past and wanted to know if a script can be sent to [facility] in chart please.

## 2020-06-02 NOTE — Progress Notes (Signed)
06/02/2020 11:02 AM   Isabella Juarez Mar 17, 1966 [id number]  Referring provider: Doree Albee, MD [address] [place],  [place] [address]  followup [facility]  HPI: Isabella Juarez is a 55yo here for followup for overactive bladder. She is currently on mirabegron 25mg  which did improve her pelvic pain and pressure. She has occasional urgency but no urge incontinence. She denies any dysuria, urinary frequency, urinary hesitancy. No hematuria. Overall her condition has imporved but she feels her pelvic pressure is still bothersome.    PMH: Past Medical History:  Diagnosis Date  . Allergic rhinitis   . Anxiety   . Axillary mass 08/2012   left  . Colon polyps   . Dental crowns present   . Diabetes mellitus    NIDDM  . Diverticulosis of sigmoid colon 08/18/2012   and descending colon  . Gallstones   . GERD (gastroesophageal reflux disease)    seldom  . Hemorrhoids   . High triglycerides   . Hypertension    under control with meds., has been on med. x 5-6 yr.  . IBS (irritable bowel syndrome)   . Interstitial cystitis    has had no problems in 25 yr.  . NASH (nonalcoholic steatohepatitis)   . PCOS (polycystic ovarian syndrome)   . Pneumonia     Surgical History: Past Surgical History:  Procedure Laterality Date  . CESAREAN SECTION  08/02/1987, 12/15/1990  . CHOLECYSTECTOMY  01/05/2005   lap. chole.  . CHOLECYSTECTOMY    . COLONOSCOPY    . COLONOSCOPY W/ POLYPECTOMY  08/18/2012  . LIVER BIOPSY  01/05/2005  . MASS EXCISION Left 09/04/2012   Procedure: Excision Left Axillary Mass 10 cm;  Surgeon: Haywood Lasso, MD;  Location: [place] [facility];  Service: General;  Laterality: Left;  . TOTAL ABDOMINAL HYSTERECTOMY W/ BILATERAL SALPINGOOPHORECTOMY  03/16/2009    Home Medications:  Allergies as of 06/02/2020      Reactions   Dyazide [hydrochlorothiazide W-triamterene]    Fatigue    Penicillins    Tanzeum [albiglutide] Other (See Comments)   Pharyngitis; burning in  the mouth   Vasotec [enalapril] Cough      Medication List       Accurate as of June 02, 2020 11:02 AM. If you have any questions, ask your nurse or doctor.        atorvastatin 80 MG tablet Commonly known as: LIPITOR Take 1 tablet by mouth once daily   citalopram 40 MG tablet Commonly known as: CELEXA Take 1 tablet by mouth once daily What changed: how much to take   diclofenac 75 MG EC tablet Commonly known as: VOLTAREN Take 1 tablet (75 mg total) by mouth 2 (two) times daily.   dicyclomine 10 MG capsule Commonly known as: BENTYL Take 2 capsules (20 mg total) by mouth 4 (four) times daily. Take 30-60 minutes prior to meals   estradiol 2 MG tablet Commonly known as: ESTRACE Take 2 tablets (4 mg total) by mouth daily. What changed: how much to take   fluconazole 150 MG tablet Commonly known as: DIFLUCAN Take 1 tablet by mouth once daily   hydrochlorothiazide 25 MG tablet Commonly known as: HYDRODIURIL Take 1 tablet (25 mg total) by mouth daily.   hydrocortisone 2.5 % rectal cream Commonly known as: ANUSOL-HC Place 1 application rectally 2 (two) times daily.   losartan 50 MG tablet Commonly known as: COZAAR Take 1 tablet (50 mg total) by mouth daily.   meclizine 12.5 MG tablet  Commonly known as: ANTIVERT Take 1 tablet (12.5 mg total) by mouth 3 (three) times daily as needed for dizziness. What changed: when to take this   Melatonin 1 MG Caps Take 1 capsule by mouth at bedtime.   metFORMIN 500 MG 24 hr tablet Commonly known as: GLUCOPHAGE-XR Take 2 tablets (1,000 mg total) by mouth 2 (two) times daily.   NP Thyroid 120 MG tablet Generic drug: thyroid TAKE 1 TABLET BY MOUTH BEFORE BREAKFAST   NP Thyroid 30 MG tablet Generic drug: thyroid Take 1 tablet (30 mg total) by mouth daily before breakfast.   ondansetron 4 MG tablet Commonly known as: Zofran Take 1 tablet (4 mg total) by mouth every 8 (eight) hours as needed for nausea or vomiting. What  changed: when to take this   pantoprazole 40 MG tablet Commonly known as: PROTONIX Take 1 tablet (40 mg total) by mouth daily.   pantoprazole 40 MG tablet Commonly known as: PROTONIX Take 1 tablet (40 mg total) by mouth 2 (two) times daily.   potassium chloride SA 20 MEQ tablet Commonly known as: KLOR-CON Take 2 tablets by mouth once daily   progesterone 200 MG capsule Commonly known as: PROMETRIUM Take 2 capsules (400 mg total) by mouth daily. What changed: how much to take   traMADol 50 MG tablet Commonly known as: Ultram Take 1 tablet (50 mg total) by mouth every 6 (six) hours as needed.   Trulicity 3 GD/9.2EQ Sopn Generic drug: Dulaglutide Inject 0.5 mLs (3 mg total) into the skin once a week.   vitamin B-12 250 MCG tablet Commonly known as: CYANOCOBALAMIN Take 250 mcg by mouth daily.   Vitamin D-3 125 MCG (5000 UT) Tabs Take 5,000 Units by mouth in the morning and at bedtime.       Allergies:  Allergies  Allergen Reactions  . Dyazide [Hydrochlorothiazide W-Triamterene]     Fatigue   . Penicillins   . Tanzeum [Albiglutide] Other (See Comments)    Pharyngitis; burning in the mouth  . Vasotec [Enalapril] Cough    Family History: Family History  Problem Relation Age of Onset  . Hypertension Mother   . Hyperlipidemia Mother   . Other Mother        breast masses  . Kidney disease Father   . Hearing loss Father   . Breast cancer Maternal Grandmother   . Stroke Maternal Grandmother   . Hearing loss Paternal Grandmother   . COPD Paternal Grandfather   . Hearing loss Paternal Uncle   . Heart disease Paternal Uncle   . Colon cancer Neg Hx   . Esophageal cancer Neg Hx   . Stomach cancer Neg Hx   . Rectal cancer Neg Hx     Social History:  reports that she has never smoked. She has never used smokeless tobacco. She reports that she does not drink alcohol and does not use drugs.  ROS: All other review of systems were reviewed and are negative except what  is noted above in HPI  Physical Exam: There were no vitals taken for this visit.  Constitutional:  Alert and oriented, No acute distress. HEENT: [place] AT, moist mucus membranes.  Trachea midline, no masses. Cardiovascular: No clubbing, cyanosis, or edema. Respiratory: Normal respiratory effort, no increased work of breathing. GI: Abdomen is soft, nontender, nondistended, no abdominal masses GU: No CVA tenderness.  Lymph: No cervical or inguinal lymphadenopathy. Skin: No rashes, bruises or suspicious lesions. Neurologic: Grossly intact, no focal deficits, moving all 4 extremities.  Psychiatric: Normal mood and affect.  Laboratory Data: Lab Results  Component Value Date   WBC 7.4 01/28/2019   HGB 14.4 01/28/2019   HCT 41.7 01/28/2019   MCV 83.4 01/28/2019   PLT 289 01/28/2019    Lab Results  Component Value Date   CREATININE 0.68 04/04/2020    No results found for: PSA  No results found for: TESTOSTERONE  Lab Results  Component Value Date   HGBA1C 5.6 04/04/2020    Urinalysis    Component Value Date/Time   APPEARANCEUR Clear 04/19/2020 1559   GLUCOSEU Negative 04/19/2020 1559   BILIRUBINUR Negative 04/19/2020 1559   PROTEINUR Negative 04/19/2020 1559   NITRITE Negative 04/19/2020 1559   LEUKOCYTESUR Negative 04/19/2020 1559    Lab Results  Component Value Date   LABMICR Comment 04/19/2020   WBCUA 0-5 04/11/2020   LABEPIT 0-10 04/11/2020   BACTERIA Few (A) 04/11/2020    Pertinent Imaging:  No results found for this or any previous visit.  No results found for this or any previous visit.  No results found for this or any previous visit.  No results found for this or any previous visit.  No results found for this or any previous visit.  No results found for this or any previous visit.  No results found for this or any previous visit.  No results found for this or any previous visit.   Assessment & Plan:    1. Overactive bladder -we will increase  mirabegron 50mg  daily -RTC 3 months - Urinalysis, Routine w reflex microscopic   No follow-ups on file.  Nicolette Bang, MD  [facility] [place]

## 2020-06-02 NOTE — Patient Instructions (Signed)
Overactive Bladder, Adult  Overactive bladder is a condition in which a person has a sudden and frequent need to urinate. A person might also leak urine if he or she cannot get to the bathroom fast enough (urinary incontinence). Sometimes, symptoms can interfere with work or social activities. What are the causes? Overactive bladder is associated with poor nerve signals between your bladder and your brain. Your bladder may get the signal to empty before it is full. You may also have very sensitive muscles that make your bladder squeeze too soon. This condition may also be caused by other factors, such as:  Medical conditions: ? Urinary tract infection. ? Infection of nearby tissues. ? Prostate enlargement. ? Bladder stones, inflammation, or tumors. ? Diabetes. ? Muscle or nerve weakness, especially from these conditions:  A spinal cord injury.  Stroke.  Multiple sclerosis.  Parkinson's disease.  Other causes: ? Surgery on the uterus or urethra. ? Drinking too much caffeine or alcohol. ? Certain medicines, especially those that eliminate extra fluid in the body (diuretics). ? Constipation. What increases the risk? You may be at greater risk for overactive bladder if you:  Are an older adult.  Smoke.  Are going through menopause.  Have prostate problems.  Have a neurological disease, such as stroke, dementia, Parkinson's disease, or multiple sclerosis (MS).  Eat or drink alcohol, spicy food, caffeine, and other things that irritate the bladder.  Are overweight or obese. What are the signs or symptoms? Symptoms of this condition include a sudden, strong urge to urinate. Other symptoms include:  Leaking urine.  Urinating 8 or more times a day.  Waking up to urinate 2 or more times overnight. How is this diagnosed? This condition may be diagnosed based on:  Your symptoms and medical history.  A physical exam.  Blood or urine tests to check for possible causes,  such as infection. You may also need to see a health care provider who specializes in urinary tract problems. This is called a urologist. How is this treated? Treatment for overactive bladder depends on the cause of your condition and whether it is mild or severe. Treatment may include:  Bladder training, such as: ? Learning to control the urge to urinate by following a schedule to urinate at regular intervals. ? Doing Kegel exercises to strengthen the pelvic floor muscles that support your bladder.  Special devices, such as: ? Biofeedback. This uses sensors to help you become aware of your body's signals. ? Electrical stimulation. This uses electrodes placed inside the body (implanted) or outside the body. These electrodes send gentle pulses of electricity to strengthen the nerves or muscles that control the bladder. ? Women may use a plastic device, called a pessary, that fits into the vagina and supports the bladder.  Medicines, such as: ? Antibiotics to treat bladder infection. ? Antispasmodics to stop the bladder from releasing urine at the wrong time. ? Tricyclic antidepressants to relax bladder muscles. ? Injections of botulinum toxin type A directly into the bladder tissue to relax bladder muscles.  Surgery, such as: ? A device may be implanted to help manage the nerve signals that control urination. ? An electrode may be implanted to stimulate electrical signals in the bladder. ? A procedure may be done to change the shape of the bladder. This is done only in very severe cases. Follow these instructions at home: Eating and drinking  Make diet or lifestyle changes recommended by your health care provider. These may include: ? Drinking fluids   throughout the day and not only with meals. ? Cutting down on caffeine or alcohol. ? Eating a healthy and balanced diet to prevent constipation. This may include:  Choosing foods that are high in fiber, such as beans, whole grains, and  fresh fruits and vegetables.  Limiting foods that are high in fat and processed sugars, such as fried and sweet foods.   Lifestyle  Lose weight if needed.  Do not use any products that contain nicotine or tobacco. These include cigarettes, chewing tobacco, and vaping devices, such as e-cigarettes. If you need help quitting, ask your health care provider.   General instructions  Take over-the-counter and prescription medicines only as told by your health care provider.  If you were prescribed an antibiotic medicine, take it as told by your health care provider. Do not stop taking the antibiotic even if you start to feel better.  Use any implants or pessary as told by your health care provider.  If needed, wear pads to absorb urine leakage.  Keep a log to track how much and when you drink, and when you need to urinate. This will help your health care provider monitor your condition.  Keep all follow-up visits. This is important. Contact a health care provider if:  You have a fever or chills.  Your symptoms do not get better with treatment.  Your pain and discomfort get worse.  You have more frequent urges to urinate. Get help right away if:  You are not able to control your bladder. Summary  Overactive bladder refers to a condition in which a person has a sudden and frequent need to urinate.  Several conditions may lead to an overactive bladder.  Treatment for overactive bladder depends on the cause and severity of your condition.  Making lifestyle changes, doing Kegel exercises, keeping a log, and taking medicines can help with this condition. This information is not intended to replace advice given to you by your health care provider. Make sure you discuss any questions you have with your health care provider. Document Revised: 01/25/2020 Document Reviewed: 01/25/2020 Elsevier Patient Education  2021 [employer]  

## 2020-06-02 NOTE — Progress Notes (Signed)
Reconciliation of medications

## 2020-06-03 MED ORDER — DOCUSATE SODIUM 100 MG PO CAPS: 100.0000 mg | ORAL_CAPSULE | Freq: Every day | ORAL | 0 refills | Status: DC

## 2020-06-03 MED ORDER — PSYLLIUM 58.6 % PO PACK: PACK | ORAL | 0 refills | Status: DC

## 2020-06-03 MED ORDER — POLYETHYLENE GLYCOL 3350 17 GM/SCOOP PO POWD: ORAL | 0 refills | Status: DC

## 2020-06-03 MED ORDER — LINACLOTIDE 145 MCG PO CAPS: 145.0000 ug | ORAL_CAPSULE | Freq: Every day | ORAL | 0 refills | Status: DC

## 2020-06-03 MED ORDER — HYDROCORTISONE (PERIANAL) 2.5 % EX CREA: 1.0000 "application " | TOPICAL_CREAM | CUTANEOUS | 1 refills | Status: DC | PRN

## 2020-06-03 NOTE — Telephone Encounter (Signed)
Called pt and informed her about Dr. Tarri Glenn response below. Pt scheduled for follow up as below:  Next Appt With Gastroenterology [place], MD) 06/27/2020 at 3:40 PM  Pt requested all instructions be sent to her via My Chart. Pt aware to call the office if no improvements in her symptoms BEFORE her upcoming appt. Otherwise, will plan to see as scheduled above.

## 2020-06-03 NOTE — Telephone Encounter (Signed)
Routing to Dr. Tarri Glenn for her review and response.

## 2020-06-03 NOTE — Telephone Encounter (Signed)
I agree with your recommendations. Given her profound diarrhea, I would be afraid of Linzess in this setting, but, I would be happy to refill the prescription that she had from Dr. Sharlett Iles in the past if she thinks this would help. The biggest reason Linzess is not tolerated is because of diarrhea.   In addition to all of you recommendations, adding daily psyllium or methycellulose to try to even out the bowel habits would be a good place to start. She could increase the dose to twice daily if no improvement after a few days.   Please schedule an office visit with me or an APP to further review. Thanks.

## 2020-06-03 NOTE — Addendum Note (Signed)
Addended by: Hardie Pulley, Thedore Pickel J on: 06/03/2020 12:54 PM   Modules accepted: Orders

## 2020-06-27 ENCOUNTER — Ambulatory Visit: Payer: BC Managed Care – PPO | Admitting: Gastroenterology

## 2020-07-04 LAB — HM DIABETES EYE EXAM

## 2020-07-05 ENCOUNTER — Encounter (INDEPENDENT_AMBULATORY_CARE_PROVIDER_SITE_OTHER): Payer: BC Managed Care – PPO | Admitting: Internal Medicine

## 2020-07-12 ENCOUNTER — Telehealth: Payer: Self-pay

## 2020-07-12 DIAGNOSIS — R109 Unspecified abdominal pain: Secondary | ICD-10-CM

## 2020-07-13 ENCOUNTER — Ambulatory Visit (HOSPITAL_COMMUNITY)
Admission: RE | Admit: 2020-07-13 | Discharge: 2020-07-13 | Disposition: A | Discharge status: Home / Self Care | Payer: BC Managed Care – PPO | Source: Ambulatory Visit | Attending: Urology | Admitting: Urology

## 2020-07-13 ENCOUNTER — Other Ambulatory Visit: Payer: Self-pay

## 2020-07-13 DIAGNOSIS — R109 Unspecified abdominal pain: Secondary | ICD-10-CM | POA: Diagnosis present

## 2020-07-13 NOTE — Telephone Encounter (Signed)
Per Dr. Alyson Ingles patient is to have CT stone study. Order placed and patent made aware.

## 2020-07-14 ENCOUNTER — Telehealth: Payer: Self-pay

## 2020-07-14 NOTE — Progress Notes (Signed)
CT shows multiple left renal calculi but no ureteral calculi. She needs to see me for cystoscopy

## 2020-07-15 ENCOUNTER — Other Ambulatory Visit: Payer: Self-pay

## 2020-07-15 ENCOUNTER — Other Ambulatory Visit: Payer: Self-pay | Admitting: Urology

## 2020-07-15 DIAGNOSIS — N200 Calculus of kidney: Secondary | ICD-10-CM

## 2020-07-15 MED ORDER — OXYCODONE-ACETAMINOPHEN 5-325 MG PO TABS: 1.0000 | ORAL_TABLET | ORAL | 0 refills | Status: DC | PRN

## 2020-07-15 NOTE — Telephone Encounter (Signed)
Patient called back and decided to go forward with lithotripsy. Patient will come by office on Tuesday to pick up litho packet.

## 2020-07-15 NOTE — Telephone Encounter (Signed)
Patient called and made aware. Patient will call back with decision to either proceed with litho or ureteroscopic.

## 2020-07-15 NOTE — Telephone Encounter (Signed)
CT shows left renal calculi. She can be scheduled for left ureteroscopic stone.

## 2020-07-19 ENCOUNTER — Telehealth: Payer: Self-pay

## 2020-07-19 ENCOUNTER — Other Ambulatory Visit: Payer: Self-pay

## 2020-07-19 NOTE — Telephone Encounter (Signed)
Spoke with Dr. Alyson Ingles. He said pt cannot have Toradol due to her lithotripsy and it is an Nsaid. Called pt and made her aware and explained again to not take the med 5 days before her procedure. Pt expressed understanding.

## 2020-07-21 ENCOUNTER — Encounter (INDEPENDENT_AMBULATORY_CARE_PROVIDER_SITE_OTHER): Payer: BC Managed Care – PPO | Admitting: Internal Medicine

## 2020-07-22 ENCOUNTER — Other Ambulatory Visit: Payer: Self-pay

## 2020-07-22 ENCOUNTER — Encounter (HOSPITAL_COMMUNITY): Payer: Self-pay

## 2020-07-22 ENCOUNTER — Encounter (HOSPITAL_COMMUNITY)
Admission: RE | Admit: 2020-07-22 | Discharge: 2020-07-22 | Disposition: A | Discharge status: Home / Self Care | Payer: BC Managed Care – PPO | Source: Ambulatory Visit | Attending: Urology | Admitting: Urology

## 2020-07-22 HISTORY — DX: Personal history of urinary calculi: Z87.442

## 2020-07-25 ENCOUNTER — Other Ambulatory Visit: Payer: Self-pay

## 2020-07-25 ENCOUNTER — Other Ambulatory Visit (HOSPITAL_COMMUNITY)
Admission: RE | Admit: 2020-07-25 | Discharge: 2020-07-25 | Disposition: A | Discharge status: Home / Self Care | Payer: BC Managed Care – PPO | Source: Ambulatory Visit | Attending: Urology | Admitting: Urology

## 2020-07-25 DIAGNOSIS — I10 Essential (primary) hypertension: Secondary | ICD-10-CM | POA: Diagnosis not present

## 2020-07-25 DIAGNOSIS — Z20822 Contact with and (suspected) exposure to covid-19: Secondary | ICD-10-CM | POA: Insufficient documentation

## 2020-07-25 DIAGNOSIS — Z01812 Encounter for preprocedural laboratory examination: Secondary | ICD-10-CM | POA: Insufficient documentation

## 2020-07-25 DIAGNOSIS — N200 Calculus of kidney: Secondary | ICD-10-CM | POA: Diagnosis present

## 2020-07-25 DIAGNOSIS — E119 Type 2 diabetes mellitus without complications: Secondary | ICD-10-CM | POA: Diagnosis not present

## 2020-07-25 LAB — SARS CORONAVIRUS 2 (TAT 6-24 HRS): SARS Coronavirus 2: NEGATIVE

## 2020-07-26 ENCOUNTER — Ambulatory Visit (HOSPITAL_COMMUNITY)
Admission: RE | Admit: 2020-07-26 | Discharge: 2020-07-26 | Disposition: A | Discharge status: Home / Self Care | Payer: BC Managed Care – PPO | Source: Home / Self Care | Attending: Urology | Admitting: Urology

## 2020-07-26 ENCOUNTER — Encounter (HOSPITAL_COMMUNITY)
Admission: RE | Disposition: A | Discharge status: Home / Self Care | Payer: Self-pay | Source: Home / Self Care | Attending: Urology

## 2020-07-26 ENCOUNTER — Ambulatory Visit (HOSPITAL_COMMUNITY)
Admission: RE | Admit: 2020-07-26 | Discharge: 2020-07-26 | Disposition: A | Discharge status: Home / Self Care | Payer: BC Managed Care – PPO | Attending: Urology | Admitting: Urology

## 2020-07-26 DIAGNOSIS — I10 Essential (primary) hypertension: Secondary | ICD-10-CM | POA: Insufficient documentation

## 2020-07-26 DIAGNOSIS — Z20822 Contact with and (suspected) exposure to covid-19: Secondary | ICD-10-CM | POA: Insufficient documentation

## 2020-07-26 DIAGNOSIS — N200 Calculus of kidney: Secondary | ICD-10-CM | POA: Diagnosis not present

## 2020-07-26 DIAGNOSIS — E119 Type 2 diabetes mellitus without complications: Secondary | ICD-10-CM | POA: Insufficient documentation

## 2020-07-26 HISTORY — PX: EXTRACORPOREAL SHOCK WAVE LITHOTRIPSY: SHX1557

## 2020-07-26 LAB — GLUCOSE, CAPILLARY: Glucose-Capillary: 119 mg/dL — ABNORMAL HIGH (ref 70–99)

## 2020-07-26 SURGERY — LITHOTRIPSY, ESWL
Anesthesia: LOCAL | Laterality: Left

## 2020-07-26 MED ORDER — ONDANSETRON HCL 4 MG PO TABS: 4.0000 mg | ORAL_TABLET | Freq: Every day | ORAL | 1 refills | Status: AC | PRN

## 2020-07-26 MED ORDER — SODIUM CHLORIDE 0.9 % IV SOLN: INTRAVENOUS | Status: DC

## 2020-07-26 MED ORDER — TAMSULOSIN HCL 0.4 MG PO CAPS: 0.4000 mg | ORAL_CAPSULE | Freq: Every day | ORAL | 0 refills | Status: DC

## 2020-07-26 MED ORDER — DIAZEPAM 5 MG PO TABS
10.0000 mg | ORAL_TABLET | Freq: Once | ORAL | Status: AC
  Administered 2020-07-26: 10 mg via ORAL
  Filled 2020-07-26: qty 2

## 2020-07-26 MED ORDER — DIPHENHYDRAMINE HCL 25 MG PO CAPS
25.0000 mg | ORAL_CAPSULE | ORAL | Status: AC
  Administered 2020-07-26: 25 mg via ORAL
  Filled 2020-07-26: qty 1

## 2020-07-26 MED ORDER — OXYCODONE-ACETAMINOPHEN 5-325 MG PO TABS: 1.0000 | ORAL_TABLET | ORAL | 0 refills | Status: DC | PRN

## 2020-07-26 NOTE — Discharge Instructions (Signed)

## 2020-07-27 ENCOUNTER — Telehealth: Payer: Self-pay

## 2020-07-27 NOTE — Telephone Encounter (Signed)
Pt called saying she had lithotripsy yesterday and she had a fever of 101.7 and took Tylenol and it came down to 99. She also has a "killer" headache. Spoke with Dr. Alyson Ingles. He said if her fever did not come down she was to go to ER. Pt expressed understanding.

## 2020-07-28 ENCOUNTER — Encounter (HOSPITAL_COMMUNITY): Payer: Self-pay | Admitting: Urology

## 2020-07-29 ENCOUNTER — Other Ambulatory Visit: Payer: BC Managed Care – PPO

## 2020-07-29 ENCOUNTER — Other Ambulatory Visit: Payer: Self-pay

## 2020-07-29 DIAGNOSIS — N200 Calculus of kidney: Secondary | ICD-10-CM

## 2020-07-29 LAB — URINALYSIS, ROUTINE W REFLEX MICROSCOPIC
Bilirubin, UA: NEGATIVE
Glucose, UA: NEGATIVE
Ketones, UA: NEGATIVE
Nitrite, UA: NEGATIVE
Protein,UA: NEGATIVE
Specific Gravity, UA: 1.015 (ref 1.005–1.030)
Urobilinogen, Ur: 0.2 mg/dL (ref 0.2–1.0)
pH, UA: 8.5 — ABNORMAL HIGH (ref 5.0–7.5)

## 2020-07-29 LAB — MICROSCOPIC EXAMINATION
Epithelial Cells (non renal): >10 /hpf — AB (ref 0–10)
Renal Epithel, UA: NONE SEEN /hpf

## 2020-07-29 MED ORDER — NITROFURANTOIN MONOHYD MACRO 100 MG PO CAPS: 100.0000 mg | ORAL_CAPSULE | Freq: Two times a day (BID) | ORAL | 0 refills | Status: DC

## 2020-07-29 NOTE — Telephone Encounter (Signed)
Called patient to follow up on symptoms. Patient states, "I feel better today and my fever broke yesterday." Patient informed that Dr. Alyson Ingles requested she drop off urine specimen  to check for infection. Patient agreed and put on lab schedule for urine drop off.

## 2020-08-01 LAB — URINE CULTURE

## 2020-08-08 ENCOUNTER — Ambulatory Visit (HOSPITAL_COMMUNITY)
Admission: RE | Admit: 2020-08-08 | Discharge: 2020-08-08 | Disposition: A | Discharge status: Home / Self Care | Payer: BC Managed Care – PPO | Source: Ambulatory Visit | Attending: Urology | Admitting: Urology

## 2020-08-08 ENCOUNTER — Other Ambulatory Visit: Payer: Self-pay

## 2020-08-08 DIAGNOSIS — N200 Calculus of kidney: Secondary | ICD-10-CM | POA: Diagnosis present

## 2020-08-09 ENCOUNTER — Encounter: Payer: Self-pay | Admitting: Urology

## 2020-08-09 ENCOUNTER — Ambulatory Visit (INDEPENDENT_AMBULATORY_CARE_PROVIDER_SITE_OTHER): Payer: BC Managed Care – PPO | Admitting: Urology

## 2020-08-09 VITALS — BP 122/71 | HR 97 | Temp 98.1°F | Ht 65.0 in | Wt 212.0 lb

## 2020-08-09 DIAGNOSIS — N200 Calculus of kidney: Secondary | ICD-10-CM

## 2020-08-09 LAB — URINALYSIS, ROUTINE W REFLEX MICROSCOPIC
Bilirubin, UA: NEGATIVE
Glucose, UA: NEGATIVE
Ketones, UA: NEGATIVE
Nitrite, UA: NEGATIVE
Protein,UA: NEGATIVE
RBC, UA: NEGATIVE
Specific Gravity, UA: 1.02 (ref 1.005–1.030)
Urobilinogen, Ur: 0.2 mg/dL (ref 0.2–1.0)
pH, UA: 5.5 (ref 5.0–7.5)

## 2020-08-09 LAB — MICROSCOPIC EXAMINATION
RBC, Urine: NONE SEEN /hpf (ref 0–2)
Renal Epithel, UA: NONE SEEN /hpf

## 2020-08-09 MED ORDER — TAMSULOSIN HCL 0.4 MG PO CAPS: 0.4000 mg | ORAL_CAPSULE | Freq: Every day | ORAL | 0 refills | Status: DC

## 2020-08-09 NOTE — Progress Notes (Signed)
Urological Symptom Review  Patient is experiencing the following symptoms: Frequent urination Get up at night to urinate Urinary tract infection  Kidney stones   Review of Systems  Gastrointestinal (upper)  : Negative for upper GI symptoms  Gastrointestinal (lower) : Diarrhea Constipation  Constitutional : Fatigue  Skin: Negative for skin symptoms  Eyes: Negative for eye symptoms  Ear/Nose/Throat : Negative for Ear/Nose/Throat symptoms  Hematologic/Lymphatic: Negative for Hematologic/Lymphatic symptoms  Cardiovascular : Negative for cardiovascular symptoms  Respiratory : Negative for respiratory symptoms  Endocrine: Excessive thirst  Musculoskeletal: Negative for musculoskeletal symptoms  Neurological: Dizziness  Psychologic: Negative for psychiatric symptoms

## 2020-08-09 NOTE — Patient Instructions (Signed)

## 2020-08-12 LAB — CALCULI, WITH PHOTOGRAPH (CLINICAL LAB)
Calcium Oxalate Dihydrate: 10 %
Calcium Oxalate Monohydrate: 85 %
Hydroxyapatite: 5 %
Weight Calculi: 101 mg

## 2020-08-16 NOTE — Progress Notes (Signed)
08/09/2020 8:42 AM   Isabella Juarez 10/26/1965 [id number]  Referring provider: Doree Albee, MD [address] [place],  [place] [address]  followup nephrolithiasis  HPI: Isabella Juarez is a 55yo here for followup for nephrolithiasis. She underwent ESWl 2 weeks ago and passed multiple fragments which she brought with her today. She denies any flank pain. She finished macrobid yesterday for a UTI diagnosed 1 week ago. She denies any worsening LUTS   PMH: Past Medical History:  Diagnosis Date  . Allergic rhinitis   . Anxiety   . Axillary mass 08/2012   left  . Colon polyps   . Dental crowns present   . Diabetes mellitus    NIDDM  . Diverticulosis of sigmoid colon 08/18/2012   and descending colon  . Gallstones   . GERD (gastroesophageal reflux disease)    seldom  . Hemorrhoids   . High triglycerides   . History of kidney stones   . Hypertension    under control with meds., has been on med. x 5-6 yr.  . IBS (irritable bowel syndrome)   . Interstitial cystitis    has had no problems in 25 yr.  . NASH (nonalcoholic steatohepatitis)   . PCOS (polycystic ovarian syndrome)   . Pneumonia     Surgical History: Past Surgical History:  Procedure Laterality Date  . CESAREAN SECTION  08/02/1987, 12/15/1990  . CHOLECYSTECTOMY  01/05/2005   lap. chole.  . CHOLECYSTECTOMY    . COLONOSCOPY    . COLONOSCOPY W/ POLYPECTOMY  08/18/2012  . EXTRACORPOREAL SHOCK WAVE LITHOTRIPSY Left 07/26/2020   Procedure: EXTRACORPOREAL SHOCK WAVE LITHOTRIPSY (ESWL);  Surgeon: Cleon Gustin, MD;  Location: [facility];  Service: Urology;  Laterality: Left;  . LIVER BIOPSY  01/05/2005  . MASS EXCISION Left 09/04/2012   Procedure: Excision Left Axillary Mass 10 cm;  Surgeon: Haywood Lasso, MD;  Location: [facility];  Service: General;  Laterality: Left;  . TOTAL ABDOMINAL HYSTERECTOMY W/ BILATERAL SALPINGOOPHORECTOMY  03/16/2009    Home Medications:  Allergies as of 08/09/2020       Reactions   Dyazide [hydrochlorothiazide W-triamterene]    Fatigue    Tanzeum [albiglutide] Other (See Comments)   Pharyngitis; burning in the mouth   Vasotec [enalapril] Cough   Penicillins Rash   Childhood allergy      Medication List       Accurate as of August 09, 2020 11:59 PM. If you have any questions, ask your nurse or doctor.        atorvastatin 80 MG tablet Commonly known as: LIPITOR Take 1 tablet by mouth once daily   Biotrue Soln Place 1 drop into both eyes at bedtime.   citalopram 40 MG tablet Commonly known as: CELEXA Take 1 tablet by mouth once daily   dicyclomine 10 MG capsule Commonly known as: BENTYL Take 2 capsules (20 mg total) by mouth 4 (four) times daily. Take 30-60 minutes prior to meals   docusate sodium 100 MG capsule Commonly known as: Stool Softener Take 1 capsule (100 mg total) by mouth daily.   estradiol 2 MG tablet Commonly known as: ESTRACE Take 2 tablets (4 mg total) by mouth daily. What changed: how much to take   hydrochlorothiazide 25 MG tablet Commonly known as: HYDRODIURIL Take 1 tablet (25 mg total) by mouth daily.   linaclotide 145 MCG Caps capsule Commonly known as: Linzess Take 1 capsule (145 mcg total) by mouth daily before breakfast. No refills can  be provided without an appt What changed:   when to take this  additional instructions   losartan 50 MG tablet Commonly known as: COZAAR Take 1 tablet (50 mg total) by mouth daily. What changed: when to take this   meclizine 12.5 MG tablet Commonly known as: ANTIVERT Take 1 tablet (12.5 mg total) by mouth 3 (three) times daily as needed for dizziness.   Melatonin 1 MG Caps Take 1 mg by mouth at bedtime.   metFORMIN 500 MG 24 hr tablet Commonly known as: GLUCOPHAGE-XR Take 2 tablets (1,000 mg total) by mouth 2 (two) times daily.   mirabegron ER 50 MG Tb24 tablet Commonly known as: MYRBETRIQ Take 1 tablet (50 mg total) by mouth daily.   nitrofurantoin  (macrocrystal-monohydrate) 100 MG capsule Commonly known as: MACROBID Take 1 capsule (100 mg total) by mouth 2 (two) times daily.   NP Thyroid 120 MG tablet Generic drug: thyroid TAKE 1 TABLET BY MOUTH BEFORE BREAKFAST What changed: See the new instructions.   NP Thyroid 30 MG tablet Generic drug: thyroid Take 1 tablet (30 mg total) by mouth daily before breakfast. What changed: when to take this   olopatadine 0.1 % ophthalmic solution Commonly known as: PATANOL Place 1 drop into both eyes 2 (two) times daily as needed for allergies.   ondansetron 4 MG tablet Commonly known as: Zofran Take 1 tablet (4 mg total) by mouth every 8 (eight) hours as needed for nausea or vomiting.   ondansetron 4 MG tablet Commonly known as: Zofran Take 1 tablet (4 mg total) by mouth daily as needed for nausea or vomiting.   oxyCODONE-acetaminophen 5-325 MG tablet Commonly known as: Percocet Take 1 tablet by mouth every 4 (four) hours as needed.   oxyCODONE-acetaminophen 5-325 MG tablet Commonly known as: Percocet Take 1 tablet by mouth every 4 (four) hours as needed for severe pain.   pantoprazole 40 MG tablet Commonly known as: PROTONIX Take 1 tablet (40 mg total) by mouth 2 (two) times daily.   potassium chloride SA 20 MEQ tablet Commonly known as: KLOR-CON Take 2 tablets by mouth once daily   progesterone 200 MG capsule Commonly known as: PROMETRIUM Take 2 capsules (400 mg total) by mouth daily. What changed:   how much to take  when to take this   tamsulosin 0.4 MG Caps capsule Commonly known as: Flomax Take 1 capsule (0.4 mg total) by mouth daily after supper.   Trulicity 3 ES/9.7NP Sopn Generic drug: Dulaglutide Inject 3 mg into the skin once a week. What changed: when to take this   Vitamin D-3 125 MCG (5000 UT) Tabs Take 5,000 Units by mouth in the morning and at bedtime.       Allergies:  Allergies  Allergen Reactions  . Dyazide [Hydrochlorothiazide  W-Triamterene]     Fatigue   . Tanzeum [Albiglutide] Other (See Comments)    Pharyngitis; burning in the mouth  . Vasotec [Enalapril] Cough  . Penicillins Rash    Childhood allergy    Family History: Family History  Problem Relation Age of Onset  . Hypertension Mother   . Hyperlipidemia Mother   . Other Mother        breast masses  . Kidney disease Father   . Hearing loss Father   . Breast cancer Maternal Grandmother   . Stroke Maternal Grandmother   . Hearing loss Paternal Grandmother   . COPD Paternal Grandfather   . Hearing loss Paternal Uncle   . Heart disease Paternal  Uncle   . Colon cancer Neg Hx   . Esophageal cancer Neg Hx   . Stomach cancer Neg Hx   . Rectal cancer Neg Hx     Social History:  reports that she has never smoked. She has never used smokeless tobacco. She reports that she does not drink alcohol and does not use drugs.  ROS: All other review of systems were reviewed and are negative except what is noted above in HPI  Physical Exam: BP 122/71   Pulse 97   Temp 98.1 F (36.7 C)   Ht 5\' 5"  (1.651 m)   Wt 212 lb (96.2 kg)   BMI 35.28 kg/m   Constitutional:  Alert and oriented, No acute distress. HEENT: [place] AT, moist mucus membranes.  Trachea midline, no masses. Cardiovascular: No clubbing, cyanosis, or edema. Respiratory: Normal respiratory effort, no increased work of breathing. GI: Abdomen is soft, nontender, nondistended, no abdominal masses GU: No CVA tenderness.  Lymph: No cervical or inguinal lymphadenopathy. Skin: No rashes, bruises or suspicious lesions. Neurologic: Grossly intact, no focal deficits, moving all 4 extremities. Psychiatric: Normal mood and affect.  Laboratory Data: Lab Results  Component Value Date   WBC 7.4 01/28/2019   HGB 14.4 01/28/2019   HCT 41.7 01/28/2019   MCV 83.4 01/28/2019   PLT 289 01/28/2019    Lab Results  Component Value Date   CREATININE 0.68 04/04/2020    No results found for: PSA  No  results found for: TESTOSTERONE  Lab Results  Component Value Date   HGBA1C 5.6 04/04/2020    Urinalysis    Component Value Date/Time   APPEARANCEUR Clear 08/09/2020 1112   GLUCOSEU Negative 08/09/2020 1112   BILIRUBINUR Negative 08/09/2020 1112   PROTEINUR Negative 08/09/2020 1112   NITRITE Negative 08/09/2020 1112   LEUKOCYTESUR Trace (A) 08/09/2020 1112    Lab Results  Component Value Date   LABMICR See below: 08/09/2020   WBCUA 0-5 08/09/2020   LABEPIT 0-10 08/09/2020   BACTERIA Few 08/09/2020    Pertinent Imaging: KUB today: Images reviewed and discussed with the patient Results for orders placed during the hospital encounter of 08/08/20  DG Abd 1 View  Narrative CLINICAL DATA:  History of recent left-sided lithotripsy.  EXAM: ABDOMEN - 1 VIEW  COMPARISON:  07/26/2020  FINDINGS: The left renal calculus is no longer identified. I do not see any definite calcifications along the expected course of the left ureter.  The bowel gas pattern is unremarkable.  The bony structures are intact.  IMPRESSION: The left renal calculus is no longer identified. No definite residual renal or ureteral calculi.   Electronically Signed By: Marijo Sanes M.D. On: 08/09/2020 14:28  No results found for this or any previous visit.  No results found for this or any previous visit.  No results found for this or any previous visit.  No results found for this or any previous visit.  No results found for this or any previous visit.  No results found for this or any previous visit.  Results for orders placed during the hospital encounter of 07/13/20  CT RENAL STONE STUDY  Narrative CLINICAL DATA:  Flank pain, kidney stone suspected  EXAM: CT ABDOMEN AND PELVIS WITHOUT CONTRAST  TECHNIQUE: Multidetector CT imaging of the abdomen and pelvis was performed following the standard protocol without IV contrast.  COMPARISON:  None.  FINDINGS: Lower chest: Lung  bases are clear.  Hepatobiliary: No focal hepatic lesion. Postcholecystectomy. No biliary dilatation.  Pancreas: Pancreas  is normal. No ductal dilatation. No pancreatic inflammation.  Spleen: Normal spleen  Adrenals/urinary tract: Adrenal glands normal. 5 mm nonobstructing calculus in the mid LEFT kidney. Two smaller 3 mm calculi are present on the LEFT. No ureterolithiasis or obstructive uropathy. No RIGHT renal calculi present.  Multiple fluid attenuation round lesions within the RIGHT kidney most consistent benign cysts.  No bladder calculi.  Stomach/Bowel: Stomach, duodenum small-bowel normal. Appendix normal. The ascending and transverse colon normal. There are diverticula the descending colon rectosigmoid colon without acute inflammation. Rectum normal  Vascular/Lymphatic: . Abdominal aorta is normal caliber with atherosclerotic calcification. There is no retroperitoneal or periportal lymphadenopathy. No pelvic lymphadenopathy.  Reproductive: Post hysterectomy.  Adnexa unremarkable  Other: No free fluid.  Musculoskeletal: No aggressive osseous lesion.  IMPRESSION: 1. LEFT nephrolithiasis without ureterolithiasis or obstructive uropathy. 2. Multiple benign appearing low-density cystic lesions of the RIGHT kidney. 3. No bladder calculi. 4.  Aortic Atherosclerosis (ICD10-I70.0).   Electronically Signed By: Suzy Bouchard M.D. On: 07/13/2020 17:10   Assessment & Plan:    1. Renal calculus RTC 1 month with KUB - Urinalysis, Routine w reflex microscopic - Calculi, with Photograph (to Clinical Lab) - Abdomen 1 view (KUB); Future   Return in about 4 weeks (around 09/06/2020) for KUB.  Nicolette Bang, MD  [facility] [place]

## 2020-08-18 ENCOUNTER — Ambulatory Visit (INDEPENDENT_AMBULATORY_CARE_PROVIDER_SITE_OTHER): Payer: BC Managed Care – PPO | Admitting: Internal Medicine

## 2020-08-18 ENCOUNTER — Other Ambulatory Visit: Payer: Self-pay

## 2020-08-18 ENCOUNTER — Encounter (INDEPENDENT_AMBULATORY_CARE_PROVIDER_SITE_OTHER): Payer: Self-pay | Admitting: Internal Medicine

## 2020-08-18 VITALS — BP 130/86 | HR 105 | Temp 97.6°F | Resp 19 | Ht 65.0 in | Wt 216.2 lb

## 2020-08-18 DIAGNOSIS — E2839 Other primary ovarian failure: Secondary | ICD-10-CM

## 2020-08-18 DIAGNOSIS — I10 Essential (primary) hypertension: Secondary | ICD-10-CM | POA: Diagnosis not present

## 2020-08-18 DIAGNOSIS — E1169 Type 2 diabetes mellitus with other specified complication: Secondary | ICD-10-CM | POA: Diagnosis not present

## 2020-08-18 NOTE — Progress Notes (Signed)
Metrics: Intervention Frequency ACO  Documented Smoking Status Yearly  Screened one or more times in 24 months  Cessation Counseling or  Active cessation medication Past 24 months  Past 24 months   Guideline developer: UpToDate (See UpToDate for funding source) Date Released: 2014       Wellness Office Visit  Subjective:  Patient ID: Isabella Juarez, female    DOB: 06-28-1965  Age: 55 y.o. MRN: [id number]  CC: This lady was scheduled for an annual physical exam but we decided to postpone this for later date. We did a follow-up visit regarding her diabetes, hypertension, dyslipidemia and morbid obesity. HPI  She also is on bioidentical hormone therapy for menopausal symptoms and she is tolerating these well. Her last hemoglobin A1c was excellent at 5.6%.  She is now following mostly a plant-based diet. She still tends to have constipation.  She uses Linzess. She is tolerating NP thyroid 120 mg in the morning and NP thyroid 30 mg at lunchtime. She continues on statin therapy. Past Medical History:  Diagnosis Date  . Allergic rhinitis   . Anxiety   . Axillary mass 08/2012   left  . Colon polyps   . Dental crowns present   . Diabetes mellitus    NIDDM  . Diverticulosis of sigmoid colon 08/18/2012   and descending colon  . Gallstones   . GERD (gastroesophageal reflux disease)    seldom  . Hemorrhoids   . High triglycerides   . History of kidney stones   . Hypertension    under control with meds., has been on med. x 5-6 yr.  . IBS (irritable bowel syndrome)   . Interstitial cystitis    has had no problems in 25 yr.  . NASH (nonalcoholic steatohepatitis)   . PCOS (polycystic ovarian syndrome)   . Pneumonia    Past Surgical History:  Procedure Laterality Date  . CESAREAN SECTION  08/02/1987, 12/15/1990  . CHOLECYSTECTOMY  01/05/2005   lap. chole.  . CHOLECYSTECTOMY    . COLONOSCOPY    . COLONOSCOPY W/ POLYPECTOMY  08/18/2012  . EXTRACORPOREAL SHOCK WAVE LITHOTRIPSY Left  07/26/2020   Procedure: EXTRACORPOREAL SHOCK WAVE LITHOTRIPSY (ESWL);  Surgeon: Cleon Gustin, MD;  Location: [facility];  Service: Urology;  Laterality: Left;  . LIVER BIOPSY  01/05/2005  . MASS EXCISION Left 09/04/2012   Procedure: Excision Left Axillary Mass 10 cm;  Surgeon: Haywood Lasso, MD;  Location: [facility];  Service: General;  Laterality: Left;  . TOTAL ABDOMINAL HYSTERECTOMY W/ BILATERAL SALPINGOOPHORECTOMY  03/16/2009     Family History  Problem Relation Age of Onset  . Hypertension Mother   . Hyperlipidemia Mother   . Other Mother        breast masses  . Kidney disease Father   . Hearing loss Father   . Breast cancer Maternal Grandmother   . Stroke Maternal Grandmother   . Hearing loss Paternal Grandmother   . COPD Paternal Grandfather   . Hearing loss Paternal Uncle   . Heart disease Paternal Uncle   . Colon cancer Neg Hx   . Esophageal cancer Neg Hx   . Stomach cancer Neg Hx   . Rectal cancer Neg Hx     Social History   Social History Narrative   Daily caffeine    Married for 18yrs.   Social History   Tobacco Use  . Smoking status: Never Smoker  . Smokeless tobacco: Never Used  Substance Use Topics  . Alcohol  use: No    Current Meds  Medication Sig  . atorvastatin (LIPITOR) 80 MG tablet Take 1 tablet by mouth once daily (Patient taking differently: Take 80 mg by mouth daily.)  . Cholecalciferol (VITAMIN D-3) 125 MCG (5000 UT) TABS Take 5,000 Units by mouth in the morning and at bedtime.   . citalopram (CELEXA) 40 MG tablet Take 1 tablet by mouth once daily (Patient taking differently: Take 40 mg by mouth daily.)  . dicyclomine (BENTYL) 10 MG capsule Take 2 capsules (20 mg total) by mouth 4 (four) times daily. Take 30-60 minutes prior to meals  . docusate sodium (STOOL SOFTENER) 100 MG capsule Take 1 capsule (100 mg total) by mouth daily.  . Dulaglutide (TRULICITY) 3 ZO/1.0RU SOPN Inject 3 mg into the skin once a week. (Patient  taking differently: Inject 3 mg into the skin every Sunday.)  . estradiol (ESTRACE) 2 MG tablet Take 2 tablets (4 mg total) by mouth daily. (Patient taking differently: Take 2 mg by mouth daily.)  . hydrochlorothiazide (HYDRODIURIL) 25 MG tablet Take 1 tablet (25 mg total) by mouth daily.  Marland Kitchen linaclotide (LINZESS) 145 MCG CAPS capsule Take 1 capsule (145 mcg total) by mouth daily before breakfast. No refills can be provided without an appt (Patient taking differently: Take 145 mcg by mouth 3 (three) times a week.)  . losartan (COZAAR) 50 MG tablet Take 1 tablet (50 mg total) by mouth daily. (Patient taking differently: Take 50 mg by mouth at bedtime.)  . meclizine (ANTIVERT) 12.5 MG tablet Take 1 tablet (12.5 mg total) by mouth 3 (three) times daily as needed for dizziness.  . Melatonin 1 MG CAPS Take 1 mg by mouth at bedtime.  . metFORMIN (GLUCOPHAGE-XR) 500 MG 24 hr tablet Take 2 tablets (1,000 mg total) by mouth 2 (two) times daily.  . mirabegron ER (MYRBETRIQ) 50 MG TB24 tablet Take 1 tablet (50 mg total) by mouth daily.  . NP THYROID 120 MG tablet TAKE 1 TABLET BY MOUTH BEFORE BREAKFAST (Patient taking differently: Take 120 mg by mouth daily before breakfast.)  . NP THYROID 30 MG tablet Take 1 tablet (30 mg total) by mouth daily before breakfast. (Patient taking differently: Take 30 mg by mouth daily with lunch.)  . olopatadine (PATANOL) 0.1 % ophthalmic solution Place 1 drop into both eyes 2 (two) times daily as needed for allergies.  Marland Kitchen ondansetron (ZOFRAN) 4 MG tablet Take 1 tablet (4 mg total) by mouth every 8 (eight) hours as needed for nausea or vomiting.  . ondansetron (ZOFRAN) 4 MG tablet Take 1 tablet (4 mg total) by mouth daily as needed for nausea or vomiting.  . pantoprazole (PROTONIX) 40 MG tablet Take 1 tablet (40 mg total) by mouth 2 (two) times daily.  . potassium chloride SA (KLOR-CON) 20 MEQ tablet Take 2 tablets by mouth once daily (Patient taking differently: Take 40 mEq by  mouth daily.)  . progesterone (PROMETRIUM) 200 MG capsule Take 2 capsules (400 mg total) by mouth daily. (Patient taking differently: Take 200 mg by mouth at bedtime.)  . Soft Lens Products (BIOTRUE) SOLN Place 1 drop into both eyes at bedtime.  . tamsulosin (FLOMAX) 0.4 MG CAPS capsule Take 1 capsule (0.4 mg total) by mouth daily after supper.  . [DISCONTINUED] nitrofurantoin, macrocrystal-monohydrate, (MACROBID) 100 MG capsule Take 1 capsule (100 mg total) by mouth 2 (two) times daily.       Objective:   Today's Vitals: BP 130/86 (BP Location: Left Arm, Patient Position: Sitting,  Cuff Size: Normal)   Pulse (!) 105   Temp 97.6 F (36.4 C) (Temporal)   Resp 19   Ht 5\' 5"  (1.651 m)   Wt 216 lb 3.2 oz (98.1 kg)   SpO2 99%   BMI 35.98 kg/m  Vitals with BMI 08/18/2020 08/09/2020 07/26/2020  Height 5\' 5"  5\' 5"  -  Weight 216 lbs 3 oz 212 lbs -  BMI 16.38 45.36 -  Systolic 468 032 122  Diastolic 86 71 88  Pulse 482 97 83     Physical Exam   She looks systemically well.  She has gained weight since last time I saw her.    Assessment   1. Essential hypertension, benign   2. Type 2 diabetes mellitus with other specified complication, without long-term current use of insulin ([place])   3. Morbid obesity ([place])   4. Female hypogonadism syndrome       Tests ordered Orders Placed This Encounter  Procedures  . COMPLETE METABOLIC PANEL WITH GFR  . Hemoglobin A1c  . T3, free  . TSH  . Estradiol  . Progesterone     Plan: 1. Continue with losartan hydrochlorothiazide for hypertension.  Check renal function. 2. Continue with Trulicity and Metformin for diabetes and we will check an A1c.  If her A1c is in a very good range, we will start thinking about reducing diabetic medications. 3. Continue with estradiol and progesterone and I will check these levels again today to make sure they are in a good range. 4. Follow-up with me in about 3 months.   No orders of the defined types  were placed in this encounter.   Doree Albee, MD

## 2020-08-19 LAB — COMPLETE METABOLIC PANEL WITH GFR
AG Ratio: 1.9 (calc) (ref 1.0–2.5)
ALT: 44 U/L — ABNORMAL HIGH (ref 6–29)
AST: 27 U/L (ref 10–35)
Albumin: 4.4 g/dL (ref 3.6–5.1)
Alkaline phosphatase (APISO): 74 U/L (ref 37–153)
BUN: 14 mg/dL (ref 7–25)
CO2: 26 mmol/L (ref 20–32)
Calcium: 9.9 mg/dL (ref 8.6–10.4)
Chloride: 101 mmol/L (ref 98–110)
Creat: 0.79 mg/dL (ref 0.50–1.05)
GFR, Est African American: 97 mL/min/{1.73_m2} (ref >=60)
GFR, Est Non African American: 84 mL/min/{1.73_m2} (ref >=60)
Globulin: 2.3 g/dL (calc) (ref 1.9–3.7)
Glucose, Bld: 148 mg/dL — ABNORMAL HIGH (ref 65–139)
Potassium: 3.5 mmol/L (ref 3.5–5.3)
Sodium: 140 mmol/L (ref 135–146)
Total Bilirubin: 0.6 mg/dL (ref 0.2–1.2)
Total Protein: 6.7 g/dL (ref 6.1–8.1)

## 2020-08-19 LAB — ESTRADIOL: Estradiol: <15 pg/mL

## 2020-08-19 LAB — PROGESTERONE: Progesterone: <0.5 ng/mL

## 2020-08-19 LAB — HEMOGLOBIN A1C
Hgb A1c MFr Bld: 6.5 % of total Hgb — ABNORMAL HIGH (ref <5.7)
Mean Plasma Glucose: 140 mg/dL
eAG (mmol/L): 7.7 mmol/L

## 2020-08-19 LAB — T3, FREE: T3, Free: 3.3 pg/mL (ref 2.3–4.2)

## 2020-08-19 LAB — TSH: TSH: 2.08 mIU/L (ref 0.40–4.50)

## 2020-08-22 ENCOUNTER — Other Ambulatory Visit (INDEPENDENT_AMBULATORY_CARE_PROVIDER_SITE_OTHER): Payer: Self-pay | Admitting: Internal Medicine

## 2020-08-22 MED ORDER — NP THYROID 60 MG PO TABS
60.0000 mg | ORAL_TABLET | Freq: Every day | ORAL | 3 refills | Status: DC
Start: 2020-08-22 — End: 2024-01-24

## 2020-09-06 ENCOUNTER — Ambulatory Visit (HOSPITAL_COMMUNITY)
Admission: RE | Admit: 2020-09-06 | Discharge: 2020-09-06 | Disposition: A | Discharge status: Home / Self Care | Payer: BC Managed Care – PPO | Source: Ambulatory Visit | Attending: Urology | Admitting: Urology

## 2020-09-06 ENCOUNTER — Other Ambulatory Visit: Payer: Self-pay

## 2020-09-06 DIAGNOSIS — N200 Calculus of kidney: Secondary | ICD-10-CM | POA: Insufficient documentation

## 2020-09-07 ENCOUNTER — Encounter: Payer: Self-pay | Admitting: Urology

## 2020-09-07 ENCOUNTER — Ambulatory Visit (INDEPENDENT_AMBULATORY_CARE_PROVIDER_SITE_OTHER): Payer: BC Managed Care – PPO | Admitting: Urology

## 2020-09-07 ENCOUNTER — Ambulatory Visit (HOSPITAL_COMMUNITY)
Admission: RE | Admit: 2020-09-07 | Discharge: 2020-09-07 | Disposition: A | Discharge status: Home / Self Care | Payer: BC Managed Care – PPO | Source: Ambulatory Visit | Attending: Urology | Admitting: Urology

## 2020-09-07 VITALS — BP 116/73 | HR 98 | Temp 98.3°F | Ht 64.0 in | Wt 215.0 lb

## 2020-09-07 DIAGNOSIS — N200 Calculus of kidney: Secondary | ICD-10-CM | POA: Insufficient documentation

## 2020-09-07 DIAGNOSIS — N3000 Acute cystitis without hematuria: Secondary | ICD-10-CM | POA: Diagnosis not present

## 2020-09-07 LAB — MICROSCOPIC EXAMINATION: RBC, Urine: NONE SEEN /hpf (ref 0–2)

## 2020-09-07 LAB — URINALYSIS, ROUTINE W REFLEX MICROSCOPIC
Bilirubin, UA: NEGATIVE
Glucose, UA: NEGATIVE
Nitrite, UA: NEGATIVE
RBC, UA: NEGATIVE
Specific Gravity, UA: 1.02 (ref 1.005–1.030)
Urobilinogen, Ur: 0.2 mg/dL (ref 0.2–1.0)
pH, UA: 6 (ref 5.0–7.5)

## 2020-09-07 NOTE — Patient Instructions (Signed)

## 2020-09-07 NOTE — Progress Notes (Signed)
09/07/2020 10:31 AM   Isabella Juarez 1966/03/10 [id number]  Referring provider: Doree Albee, MD [address] [place],  [place] [address]  nephrolithiasis  HPI: Ms Isabella Juarez is a 55yo here for followup for nephrolithiasis. No stone events since last visit. She denies any flank pain. KUB shows a new 1cm left renal calcification. She has stable urinary urgency and frequency. She previously tried mirabegron with mixed results   PMH: Past Medical History:  Diagnosis Date  . Allergic rhinitis   . Anxiety   . Axillary mass 08/2012   left  . Colon polyps   . Dental crowns present   . Diabetes mellitus    NIDDM  . Diverticulosis of sigmoid colon 08/18/2012   and descending colon  . Gallstones   . GERD (gastroesophageal reflux disease)    seldom  . Hemorrhoids   . High triglycerides   . History of kidney stones   . Hypertension    under control with meds., has been on med. x 5-6 yr.  . IBS (irritable bowel syndrome)   . Interstitial cystitis    has had no problems in 25 yr.  . NASH (nonalcoholic steatohepatitis)   . PCOS (polycystic ovarian syndrome)   . Pneumonia     Surgical History: Past Surgical History:  Procedure Laterality Date  . CESAREAN SECTION  08/02/1987, 12/15/1990  . CHOLECYSTECTOMY  01/05/2005   lap. chole.  . CHOLECYSTECTOMY    . COLONOSCOPY    . COLONOSCOPY W/ POLYPECTOMY  08/18/2012  . EXTRACORPOREAL SHOCK WAVE LITHOTRIPSY Left 07/26/2020   Procedure: EXTRACORPOREAL SHOCK WAVE LITHOTRIPSY (ESWL);  Surgeon: Cleon Gustin, MD;  Location: [facility];  Service: Urology;  Laterality: Left;  . LIVER BIOPSY  01/05/2005  . MASS EXCISION Left 09/04/2012   Procedure: Excision Left Axillary Mass 10 cm;  Surgeon: Haywood Lasso, MD;  Location: [facility];  Service: General;  Laterality: Left;  . TOTAL ABDOMINAL HYSTERECTOMY W/ BILATERAL SALPINGOOPHORECTOMY  03/16/2009    Home Medications:  Allergies as of 09/07/2020      Reactions   Dyazide  [hydrochlorothiazide W-triamterene]    Fatigue    Tanzeum [albiglutide] Other (See Comments)   Pharyngitis; burning in the mouth   Vasotec [enalapril] Cough   Penicillins Rash   Childhood allergy      Medication List       Accurate as of September 07, 2020 10:31 AM. If you have any questions, ask your nurse or doctor.        atorvastatin 80 MG tablet Commonly known as: LIPITOR Take 1 tablet by mouth once daily   Biotrue Soln Place 1 drop into both eyes at bedtime.   citalopram 40 MG tablet Commonly known as: CELEXA Take 1 tablet by mouth once daily   dicyclomine 10 MG capsule Commonly known as: BENTYL Take 2 capsules (20 mg total) by mouth 4 (four) times daily. Take 30-60 minutes prior to meals   docusate sodium 100 MG capsule Commonly known as: Stool Softener Take 1 capsule (100 mg total) by mouth daily.   estradiol 2 MG tablet Commonly known as: ESTRACE Take 2 tablets (4 mg total) by mouth daily. What changed: how much to take   hydrochlorothiazide 25 MG tablet Commonly known as: HYDRODIURIL Take 1 tablet (25 mg total) by mouth daily.   linaclotide 145 MCG Caps capsule Commonly known as: Linzess Take 1 capsule (145 mcg total) by mouth daily before breakfast. No refills can be provided without an appt  What changed:   when to take this  additional instructions   losartan 50 MG tablet Commonly known as: COZAAR Take 1 tablet (50 mg total) by mouth daily. What changed: when to take this   meclizine 12.5 MG tablet Commonly known as: ANTIVERT Take 1 tablet (12.5 mg total) by mouth 3 (three) times daily as needed for dizziness.   Melatonin 1 MG Caps Take 1 mg by mouth at bedtime.   metFORMIN 500 MG 24 hr tablet Commonly known as: GLUCOPHAGE-XR Take 2 tablets (1,000 mg total) by mouth 2 (two) times daily.   methocarbamol 500 MG tablet Commonly known as: ROBAXIN Take 1 tablet by mouth at bedtime.   mirabegron ER 50 MG Tb24 tablet Commonly known as:  MYRBETRIQ Take 1 tablet (50 mg total) by mouth daily.   NP Thyroid 120 MG tablet Generic drug: thyroid TAKE 1 TABLET BY MOUTH BEFORE BREAKFAST What changed: See the new instructions.   NP Thyroid 60 MG tablet Generic drug: thyroid Take 1 tablet (60 mg total) by mouth daily before breakfast. What changed: Another medication with the same name was changed. Make sure you understand how and when to take each.   olopatadine 0.1 % ophthalmic solution Commonly known as: PATANOL Place 1 drop into both eyes 2 (two) times daily as needed for allergies.   ondansetron 4 MG tablet Commonly known as: Zofran Take 1 tablet (4 mg total) by mouth every 8 (eight) hours as needed for nausea or vomiting.   ondansetron 4 MG tablet Commonly known as: Zofran Take 1 tablet (4 mg total) by mouth daily as needed for nausea or vomiting.   pantoprazole 40 MG tablet Commonly known as: PROTONIX Take 1 tablet (40 mg total) by mouth 2 (two) times daily.   potassium chloride SA 20 MEQ tablet Commonly known as: KLOR-CON Take 2 tablets by mouth once daily   progesterone 200 MG capsule Commonly known as: PROMETRIUM Take 2 capsules (400 mg total) by mouth daily. What changed:   how much to take  when to take this   tamsulosin 0.4 MG Caps capsule Commonly known as: Flomax Take 1 capsule (0.4 mg total) by mouth daily after supper.   Trulicity 3 EX/5.2WU Sopn Generic drug: Dulaglutide Inject 3 mg into the skin once a week. What changed: when to take this   Vitamin D-3 125 MCG (5000 UT) Tabs Take 5,000 Units by mouth in the morning and at bedtime.       Allergies:  Allergies  Allergen Reactions  . Dyazide [Hydrochlorothiazide W-Triamterene]     Fatigue   . Tanzeum [Albiglutide] Other (See Comments)    Pharyngitis; burning in the mouth  . Vasotec [Enalapril] Cough  . Penicillins Rash    Childhood allergy    Family History: Family History  Problem Relation Age of Onset  . Hypertension  Mother   . Hyperlipidemia Mother   . Other Mother        breast masses  . Kidney disease Father   . Hearing loss Father   . Breast cancer Maternal Grandmother   . Stroke Maternal Grandmother   . Hearing loss Paternal Grandmother   . COPD Paternal Grandfather   . Hearing loss Paternal Uncle   . Heart disease Paternal Uncle   . Colon cancer Neg Hx   . Esophageal cancer Neg Hx   . Stomach cancer Neg Hx   . Rectal cancer Neg Hx     Social History:  reports that she has never smoked.  She has never used smokeless tobacco. She reports that she does not drink alcohol and does not use drugs.  ROS: All other review of systems were reviewed and are negative except what is noted above in HPI  Physical Exam: BP 116/73   Pulse 98   Temp 98.3 F (36.8 C)   Ht 5\' 4"  (1.626 m)   Wt 215 lb (97.5 kg)   BMI 36.90 kg/m   Constitutional:  Alert and oriented, No acute distress. HEENT: [place] AT, moist mucus membranes.  Trachea midline, no masses. Cardiovascular: No clubbing, cyanosis, or edema. Respiratory: Normal respiratory effort, no increased work of breathing. GI: Abdomen is soft, nontender, nondistended, no abdominal masses GU: No CVA tenderness.  Lymph: No cervical or inguinal lymphadenopathy. Skin: No rashes, bruises or suspicious lesions. Neurologic: Grossly intact, no focal deficits, moving all 4 extremities. Psychiatric: Normal mood and affect.  Laboratory Data: Lab Results  Component Value Date   WBC 7.4 01/28/2019   HGB 14.4 01/28/2019   HCT 41.7 01/28/2019   MCV 83.4 01/28/2019   PLT 289 01/28/2019    Lab Results  Component Value Date   CREATININE 0.79 08/18/2020    No results found for: PSA  No results found for: TESTOSTERONE  Lab Results  Component Value Date   HGBA1C 6.5 (H) 08/18/2020    Urinalysis    Component Value Date/Time   APPEARANCEUR Clear 08/09/2020 1112   GLUCOSEU Negative 08/09/2020 1112   BILIRUBINUR Negative 08/09/2020 1112   PROTEINUR  Negative 08/09/2020 1112   NITRITE Negative 08/09/2020 1112   LEUKOCYTESUR Trace (A) 08/09/2020 1112    Lab Results  Component Value Date   LABMICR See below: 08/09/2020   WBCUA 0-5 08/09/2020   LABEPIT 0-10 08/09/2020   BACTERIA Few 08/09/2020    Pertinent Imaging: KUB today: Images reviewed and discussed with the patient Results for orders placed during the hospital encounter of 08/08/20  DG Abd 1 View  Narrative CLINICAL DATA:  History of recent left-sided lithotripsy.  EXAM: ABDOMEN - 1 VIEW  COMPARISON:  07/26/2020  FINDINGS: The left renal calculus is no longer identified. I do not see any definite calcifications along the expected course of the left ureter.  The bowel gas pattern is unremarkable.  The bony structures are intact.  IMPRESSION: The left renal calculus is no longer identified. No definite residual renal or ureteral calculi.   Electronically Signed By: Marijo Sanes M.D. On: 08/09/2020 14:28  No results found for this or any previous visit.  No results found for this or any previous visit.  No results found for this or any previous visit.  No results found for this or any previous visit.  No results found for this or any previous visit.  No results found for this or any previous visit.  Results for orders placed during the hospital encounter of 07/13/20  CT RENAL STONE STUDY  Narrative CLINICAL DATA:  Flank pain, kidney stone suspected  EXAM: CT ABDOMEN AND PELVIS WITHOUT CONTRAST  TECHNIQUE: Multidetector CT imaging of the abdomen and pelvis was performed following the standard protocol without IV contrast.  COMPARISON:  None.  FINDINGS: Lower chest: Lung bases are clear.  Hepatobiliary: No focal hepatic lesion. Postcholecystectomy. No biliary dilatation.  Pancreas: Pancreas is normal. No ductal dilatation. No pancreatic inflammation.  Spleen: Normal spleen  Adrenals/urinary tract: Adrenal glands normal. 5 mm  nonobstructing calculus in the mid LEFT kidney. Two smaller 3 mm calculi are present on the LEFT. No ureterolithiasis or obstructive  uropathy. No RIGHT renal calculi present.  Multiple fluid attenuation round lesions within the RIGHT kidney most consistent benign cysts.  No bladder calculi.  Stomach/Bowel: Stomach, duodenum small-bowel normal. Appendix normal. The ascending and transverse colon normal. There are diverticula the descending colon rectosigmoid colon without acute inflammation. Rectum normal  Vascular/Lymphatic: . Abdominal aorta is normal caliber with atherosclerotic calcification. There is no retroperitoneal or periportal lymphadenopathy. No pelvic lymphadenopathy.  Reproductive: Post hysterectomy.  Adnexa unremarkable  Other: No free fluid.  Musculoskeletal: No aggressive osseous lesion.  IMPRESSION: 1. LEFT nephrolithiasis without ureterolithiasis or obstructive uropathy. 2. Multiple benign appearing low-density cystic lesions of the RIGHT kidney. 3. No bladder calculi. 4.  Aortic Atherosclerosis (ICD10-I70.0).   Electronically Signed By: Suzy Bouchard M.D. On: 07/13/2020 17:10   Assessment & Plan:    1. Renal calculus - Urinalysis, Routine w reflex microscopic - CT RENAL STONE STUDY   No follow-ups on file.  Nicolette Bang, MD  [facility] [place]

## 2020-09-07 NOTE — Progress Notes (Signed)
Urological Symptom Review  Patient is experiencing the following symptoms: Frequent urination Burning/pain with urination Get up at night to urinate  Kidney stones   Review of Systems  Gastrointestinal (upper)  : Negative for upper GI symptoms  Gastrointestinal (lower) : Diarrhea Constipation  Constitutional : Fatigue  Skin: Negative for skin symptoms  Eyes: Negative for eye symptoms  Ear/Nose/Throat : Negative for Ear/Nose/Throat symptoms  Hematologic/Lymphatic: Negative for Hematologic/Lymphatic symptoms  Cardiovascular : Negative for cardiovascular symptoms  Respiratory : Negative for respiratory symptoms  Endocrine: Negative for endocrine symptoms  Musculoskeletal: Negative for musculoskeletal symptoms  Neurological: Negative for neurological symptoms  Psychologic: Depression Anxiety

## 2020-09-08 NOTE — Progress Notes (Signed)
Ct showed the calcification on the KUB was bowel related and not a renal calculus. I will see her back in 3 months with KUB.

## 2020-09-09 ENCOUNTER — Other Ambulatory Visit: Payer: Self-pay

## 2020-09-09 DIAGNOSIS — N200 Calculus of kidney: Secondary | ICD-10-CM

## 2020-09-10 ENCOUNTER — Other Ambulatory Visit (INDEPENDENT_AMBULATORY_CARE_PROVIDER_SITE_OTHER): Payer: Self-pay | Admitting: Internal Medicine

## 2020-09-10 LAB — URINE CULTURE

## 2020-09-12 ENCOUNTER — Other Ambulatory Visit: Payer: Self-pay

## 2020-09-12 ENCOUNTER — Telehealth: Payer: Self-pay

## 2020-09-12 ENCOUNTER — Ambulatory Visit
Admission: RE | Admit: 2020-09-12 | Discharge: 2020-09-12 | Disposition: A | Discharge status: Home / Self Care | Payer: BC Managed Care – PPO | Source: Ambulatory Visit | Attending: Obstetrics and Gynecology | Admitting: Obstetrics and Gynecology

## 2020-09-12 DIAGNOSIS — N6489 Other specified disorders of breast: Secondary | ICD-10-CM

## 2020-09-12 DIAGNOSIS — N200 Calculus of kidney: Secondary | ICD-10-CM

## 2020-09-12 DIAGNOSIS — N390 Urinary tract infection, site not specified: Secondary | ICD-10-CM

## 2020-09-12 MED ORDER — AMOXICILLIN-POT CLAVULANATE 875-125 MG PO TABS: 1.0000 | ORAL_TABLET | Freq: Two times a day (BID) | ORAL | 0 refills | Status: DC

## 2020-09-12 MED ORDER — TAMSULOSIN HCL 0.4 MG PO CAPS: 0.4000 mg | ORAL_CAPSULE | Freq: Every day | ORAL | 6 refills | Status: DC

## 2020-09-12 NOTE — Telephone Encounter (Signed)
Called pt and left message that two prescriptions were sent in for her to [facility]. Then called back to tell her to take benadryl  because she could get small rash because she is allergic to penicillin. Had to leave message again.

## 2020-09-13 ENCOUNTER — Telehealth: Payer: Self-pay

## 2020-09-13 NOTE — Telephone Encounter (Signed)
Patient returned call and made aware of both prescriptions sent in to pharmacy. Patient also made aware the take benadryl if she develop rash due to medication. Patient voiced understanding.

## 2020-09-13 NOTE — Telephone Encounter (Signed)
-----   Message from Cleon Gustin, MD sent at 09/13/2020  8:03 AM EDT ----- Augmentin 875 BID for 7 days ----- Message ----- From: Dorisann Frames, RN Sent: 09/12/2020   9:31 AM EDT To: Cleon Gustin, MD  Please review- pt not on any medicatoin

## 2020-09-24 ENCOUNTER — Other Ambulatory Visit (INDEPENDENT_AMBULATORY_CARE_PROVIDER_SITE_OTHER): Payer: Self-pay | Admitting: Internal Medicine

## 2020-10-08 ENCOUNTER — Other Ambulatory Visit (INDEPENDENT_AMBULATORY_CARE_PROVIDER_SITE_OTHER): Payer: Self-pay | Admitting: Internal Medicine

## 2020-10-22 ENCOUNTER — Other Ambulatory Visit (INDEPENDENT_AMBULATORY_CARE_PROVIDER_SITE_OTHER): Payer: Self-pay | Admitting: Internal Medicine

## 2020-10-27 ENCOUNTER — Other Ambulatory Visit: Payer: Self-pay

## 2020-10-27 ENCOUNTER — Telehealth: Payer: Self-pay

## 2020-10-27 ENCOUNTER — Other Ambulatory Visit: Payer: BC Managed Care – PPO

## 2020-10-27 ENCOUNTER — Ambulatory Visit (HOSPITAL_COMMUNITY)
Admission: RE | Admit: 2020-10-27 | Discharge: 2020-10-27 | Disposition: A | Discharge status: Home / Self Care | Payer: BC Managed Care – PPO | Source: Ambulatory Visit | Attending: Urology | Admitting: Urology

## 2020-10-27 DIAGNOSIS — N200 Calculus of kidney: Secondary | ICD-10-CM

## 2020-10-27 LAB — URINALYSIS, ROUTINE W REFLEX MICROSCOPIC
Bilirubin, UA: NEGATIVE
Glucose, UA: NEGATIVE
Ketones, UA: NEGATIVE
Nitrite, UA: NEGATIVE
Protein,UA: NEGATIVE
RBC, UA: NEGATIVE
Specific Gravity, UA: 1.025 (ref 1.005–1.030)
Urobilinogen, Ur: 0.2 mg/dL (ref 0.2–1.0)
pH, UA: 5.5 (ref 5.0–7.5)

## 2020-10-27 LAB — MICROSCOPIC EXAMINATION
Epithelial Cells (non renal): >10 /hpf — AB (ref 0–10)
RBC, Urine: NONE SEEN /hpf (ref 0–2)
Renal Epithel, UA: NONE SEEN /hpf

## 2020-10-30 LAB — URINE CULTURE

## 2020-10-31 ENCOUNTER — Telehealth: Payer: Self-pay

## 2020-10-31 DIAGNOSIS — N390 Urinary tract infection, site not specified: Secondary | ICD-10-CM

## 2020-10-31 MED ORDER — DOXYCYCLINE HYCLATE 100 MG PO CAPS: 100.0000 mg | ORAL_CAPSULE | Freq: Two times a day (BID) | ORAL | 0 refills | Status: DC

## 2020-10-31 NOTE — Telephone Encounter (Signed)
Patient made aware of x-ray results and rx sent in to pharmacy.

## 2020-10-31 NOTE — Progress Notes (Signed)
Results sent via my chart 

## 2020-11-04 NOTE — Telephone Encounter (Signed)
See previous note

## 2020-11-29 ENCOUNTER — Ambulatory Visit (INDEPENDENT_AMBULATORY_CARE_PROVIDER_SITE_OTHER): Payer: BC Managed Care – PPO | Admitting: Internal Medicine

## 2020-12-07 ENCOUNTER — Other Ambulatory Visit (INDEPENDENT_AMBULATORY_CARE_PROVIDER_SITE_OTHER): Payer: Self-pay | Admitting: Internal Medicine

## 2020-12-19 ENCOUNTER — Other Ambulatory Visit (INDEPENDENT_AMBULATORY_CARE_PROVIDER_SITE_OTHER): Payer: Self-pay | Admitting: Internal Medicine

## 2020-12-22 ENCOUNTER — Encounter (INDEPENDENT_AMBULATORY_CARE_PROVIDER_SITE_OTHER): Payer: Self-pay

## 2020-12-22 ENCOUNTER — Ambulatory Visit (INDEPENDENT_AMBULATORY_CARE_PROVIDER_SITE_OTHER): Payer: BC Managed Care – PPO | Admitting: Nurse Practitioner

## 2020-12-24 ENCOUNTER — Other Ambulatory Visit (INDEPENDENT_AMBULATORY_CARE_PROVIDER_SITE_OTHER): Payer: Self-pay | Admitting: Internal Medicine

## 2021-01-03 ENCOUNTER — Other Ambulatory Visit (INDEPENDENT_AMBULATORY_CARE_PROVIDER_SITE_OTHER): Payer: Self-pay

## 2021-01-03 MED ORDER — HYDROCHLOROTHIAZIDE 25 MG PO TABS: 25.0000 mg | ORAL_TABLET | Freq: Every day | ORAL | 0 refills | Status: AC

## 2021-01-03 MED ORDER — TRULICITY 3 MG/0.5ML ~~LOC~~ SOAJ: 3.0000 mg | SUBCUTANEOUS | 3 refills | Status: DC

## 2021-02-18 ENCOUNTER — Other Ambulatory Visit: Payer: Self-pay | Admitting: Gastroenterology

## 2021-02-18 DIAGNOSIS — K625 Hemorrhage of anus and rectum: Secondary | ICD-10-CM

## 2021-02-18 DIAGNOSIS — R112 Nausea with vomiting, unspecified: Secondary | ICD-10-CM

## 2021-02-18 DIAGNOSIS — K529 Noninfective gastroenteritis and colitis, unspecified: Secondary | ICD-10-CM

## 2021-03-01 ENCOUNTER — Ambulatory Visit: Payer: BC Managed Care – PPO | Admitting: Urology

## 2021-03-01 DIAGNOSIS — R350 Frequency of micturition: Secondary | ICD-10-CM

## 2021-03-01 DIAGNOSIS — N390 Urinary tract infection, site not specified: Secondary | ICD-10-CM

## 2021-03-01 DIAGNOSIS — N3281 Overactive bladder: Secondary | ICD-10-CM

## 2021-03-06 ENCOUNTER — Telehealth: Payer: Self-pay

## 2021-03-06 ENCOUNTER — Ambulatory Visit (HOSPITAL_COMMUNITY)
Admission: RE | Admit: 2021-03-06 | Discharge: 2021-03-06 | Disposition: A | Discharge status: Home / Self Care | Payer: BC Managed Care – PPO | Source: Ambulatory Visit | Attending: Urology | Admitting: Urology

## 2021-03-06 ENCOUNTER — Other Ambulatory Visit: Payer: BC Managed Care – PPO

## 2021-03-06 ENCOUNTER — Other Ambulatory Visit: Payer: Self-pay

## 2021-03-06 DIAGNOSIS — N3000 Acute cystitis without hematuria: Secondary | ICD-10-CM

## 2021-03-06 DIAGNOSIS — N200 Calculus of kidney: Secondary | ICD-10-CM

## 2021-03-06 LAB — URINALYSIS, ROUTINE W REFLEX MICROSCOPIC
Bilirubin, UA: NEGATIVE
Ketones, UA: NEGATIVE
Leukocytes,UA: NEGATIVE
Nitrite, UA: NEGATIVE
Protein,UA: NEGATIVE
RBC, UA: NEGATIVE
Specific Gravity, UA: 1.01 (ref 1.005–1.030)
Urobilinogen, Ur: 0.2 mg/dL (ref 0.2–1.0)
pH, UA: 5 (ref 5.0–7.5)

## 2021-03-06 NOTE — Telephone Encounter (Signed)
Patient called complaining of pressure and urgency. She states ,"It feels just like it did when I had my kidney stone." Per Dr. Alyson Ingles patient can go get KUB.  Patient called and made aware.  Order placed.

## 2021-03-06 NOTE — Telephone Encounter (Signed)
Pt called to let you know that she when to the hospital to have her x-ray done, please advise

## 2021-03-06 NOTE — Telephone Encounter (Signed)
Patient called and made aware.

## 2021-03-08 LAB — URINE CULTURE

## 2021-03-15 NOTE — Progress Notes (Signed)
Results sent via my chart 

## 2021-03-29 ENCOUNTER — Other Ambulatory Visit (HOSPITAL_COMMUNITY): Payer: Self-pay | Admitting: Family Medicine

## 2021-03-29 DIAGNOSIS — R011 Cardiac murmur, unspecified: Secondary | ICD-10-CM

## 2021-04-06 ENCOUNTER — Other Ambulatory Visit: Payer: Self-pay

## 2021-04-06 ENCOUNTER — Ambulatory Visit (HOSPITAL_COMMUNITY)
Admission: RE | Admit: 2021-04-06 | Discharge: 2021-04-06 | Disposition: A | Discharge status: Home / Self Care | Payer: BC Managed Care – PPO | Source: Ambulatory Visit | Attending: Family Medicine | Admitting: Family Medicine

## 2021-04-06 DIAGNOSIS — R011 Cardiac murmur, unspecified: Secondary | ICD-10-CM

## 2021-04-06 LAB — ECHOCARDIOGRAM COMPLETE
AR max vel: 1.69 cm2
AV Area VTI: 1.77 cm2
AV Area mean vel: 1.43 cm2
AV Mean grad: 11 mmHg
AV Peak grad: 19.9 mmHg
Ao pk vel: 2.23 m/s
Area-P 1/2: 4.39 cm2
Calc EF: 50 %
MV VTI: 3.41 cm2
S' Lateral: 2.4 cm
Single Plane A2C EF: 46.8 %
Single Plane A4C EF: 53.7 %

## 2021-04-06 NOTE — Progress Notes (Incomplete)
*  PRELIMINARY RESULTS* Echocardiogram 2D Echocardiogram has been performed.  Isabella Juarez 04/06/2021, 3:09 PM

## 2021-11-02 ENCOUNTER — Other Ambulatory Visit: Payer: Self-pay | Admitting: Obstetrics and Gynecology

## 2021-11-02 DIAGNOSIS — N6489 Other specified disorders of breast: Secondary | ICD-10-CM

## 2022-03-18 IMAGING — MG MM DIGITAL DIAGNOSTIC UNILAT*R* W/ TOMO W/ CAD
6 series · 6 of 18 positions shown · non-contrast
Comparison: Previous exam(s).

CLINICAL DATA: 54-year-old female recalled from screening mammogram
dated 09/01/2019 for a possible right breast asymmetry.

EXAM:
DIGITAL DIAGNOSTIC RIGHT MAMMOGRAM WITH CAD AND TOMO
ULTRASOUND RIGHT BREAST

[R MLO synth-2D (1 of 2)]
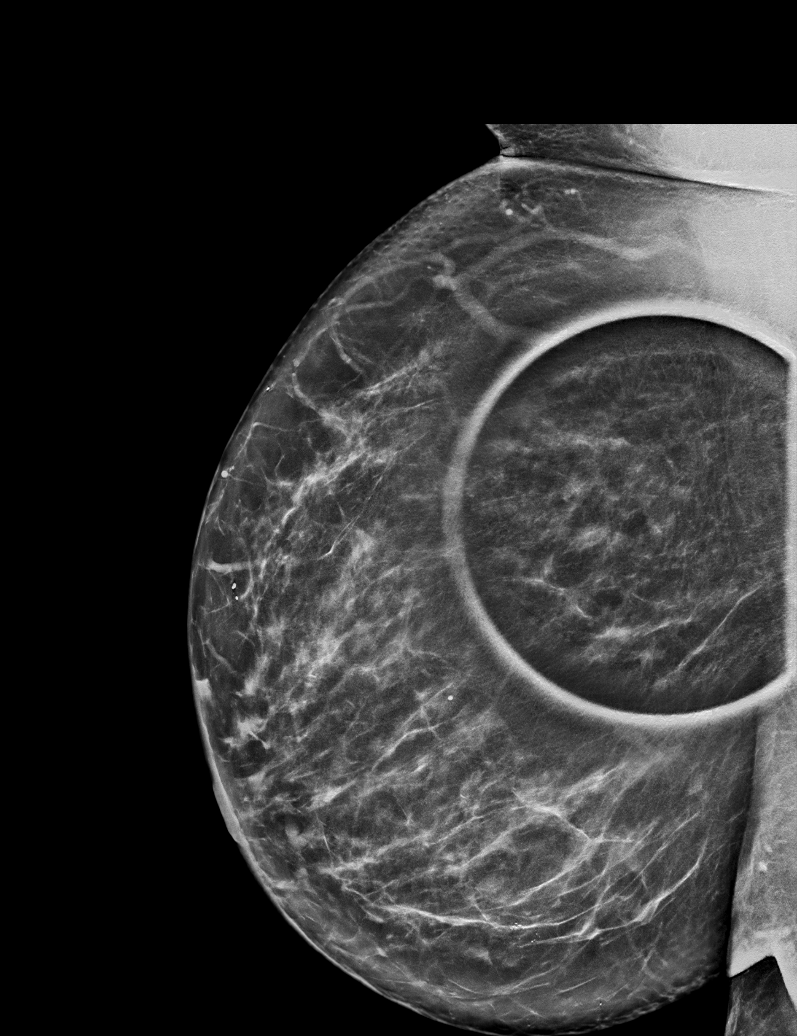

[R MLO synth-2D (2 of 2)]
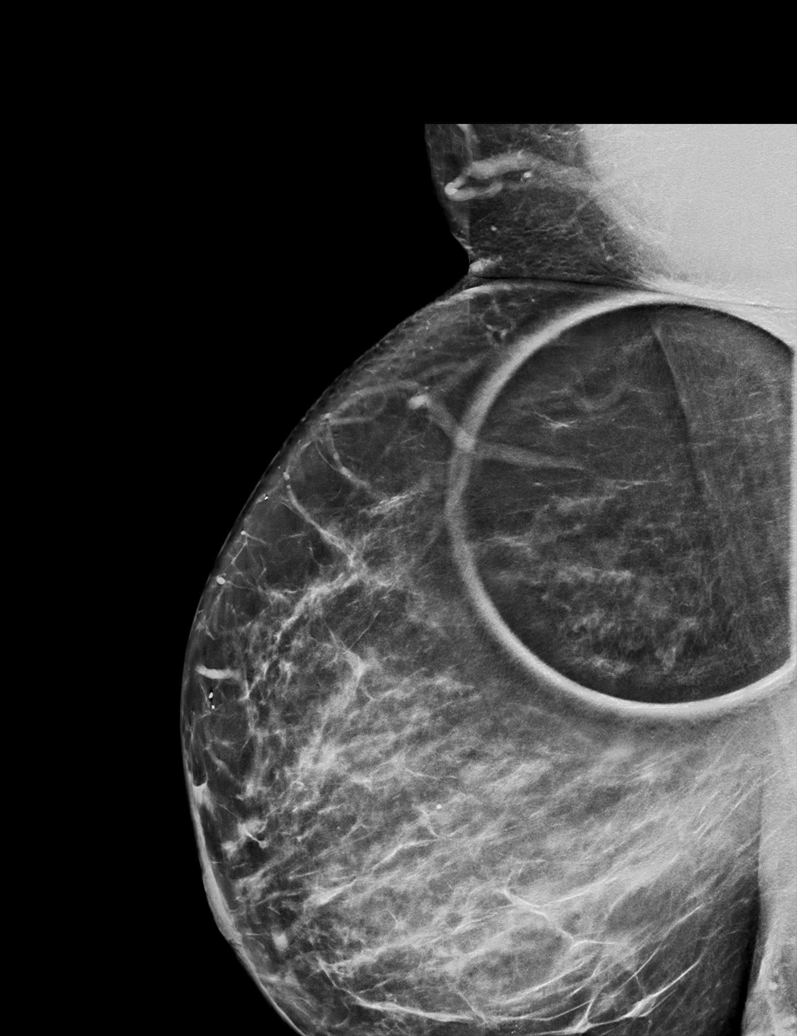

[R ML synth-2D]
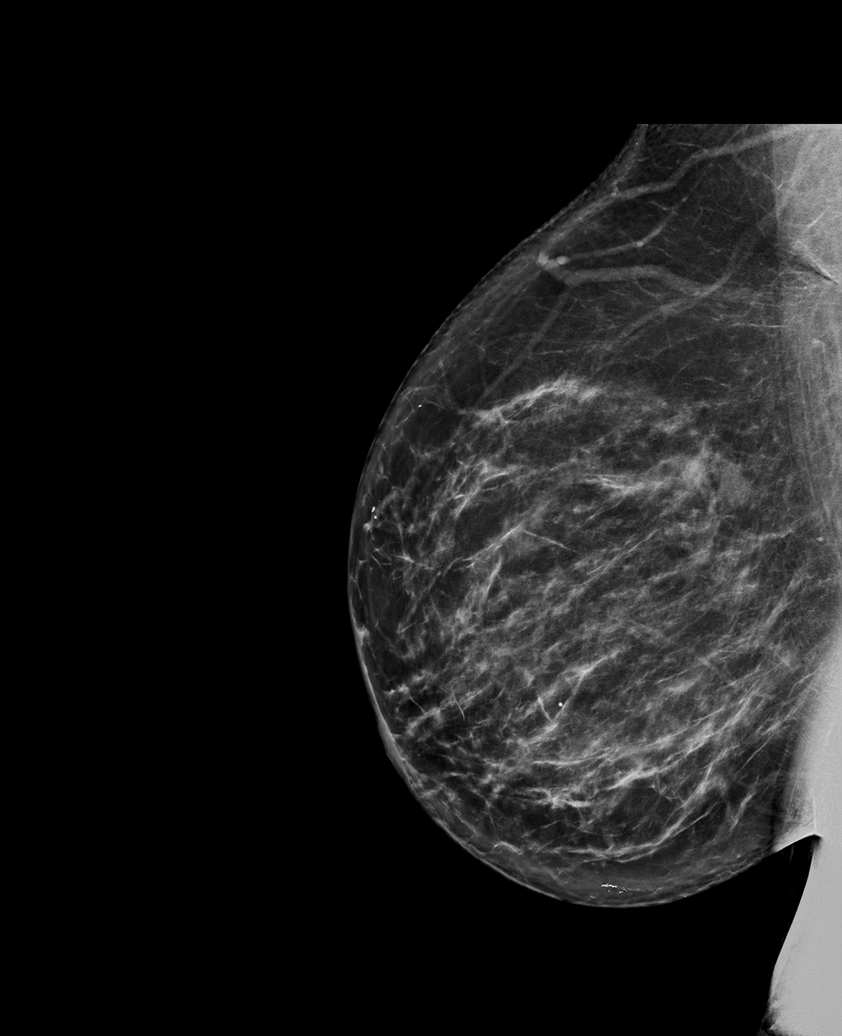

[R ML tomo · tomo slice 45/88.0]
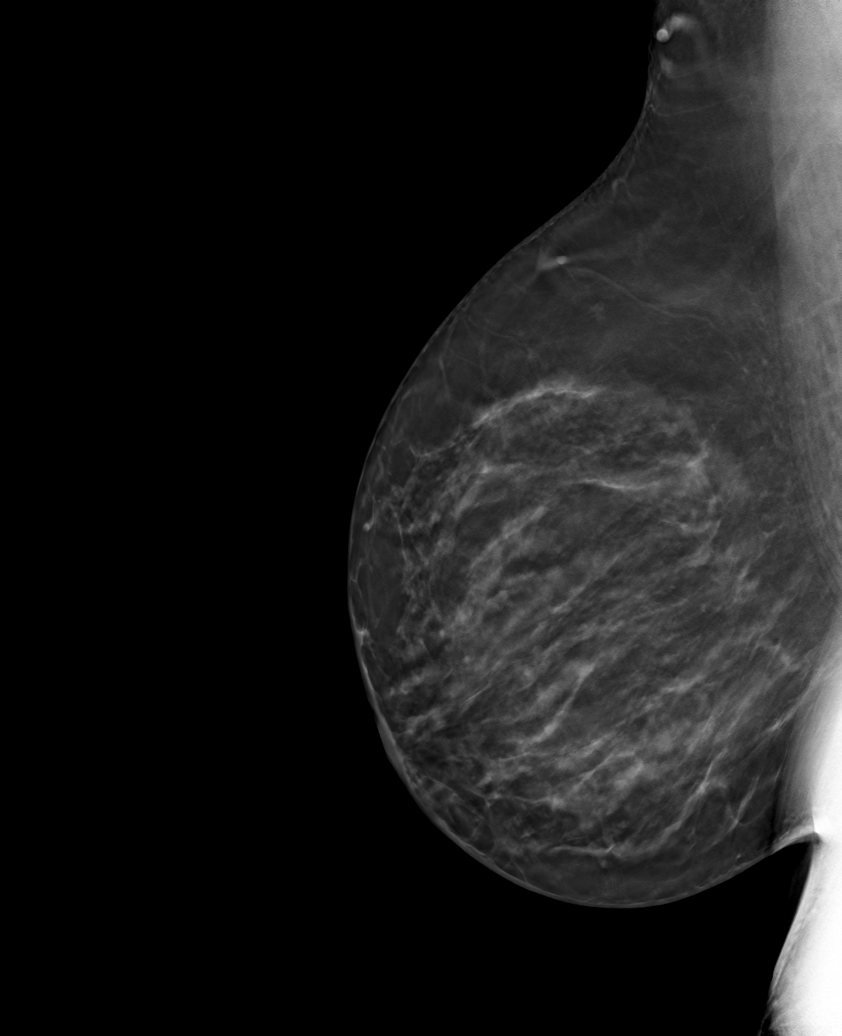

[R MLO tomo (1 of 2) · tomo slice 43/85.0]
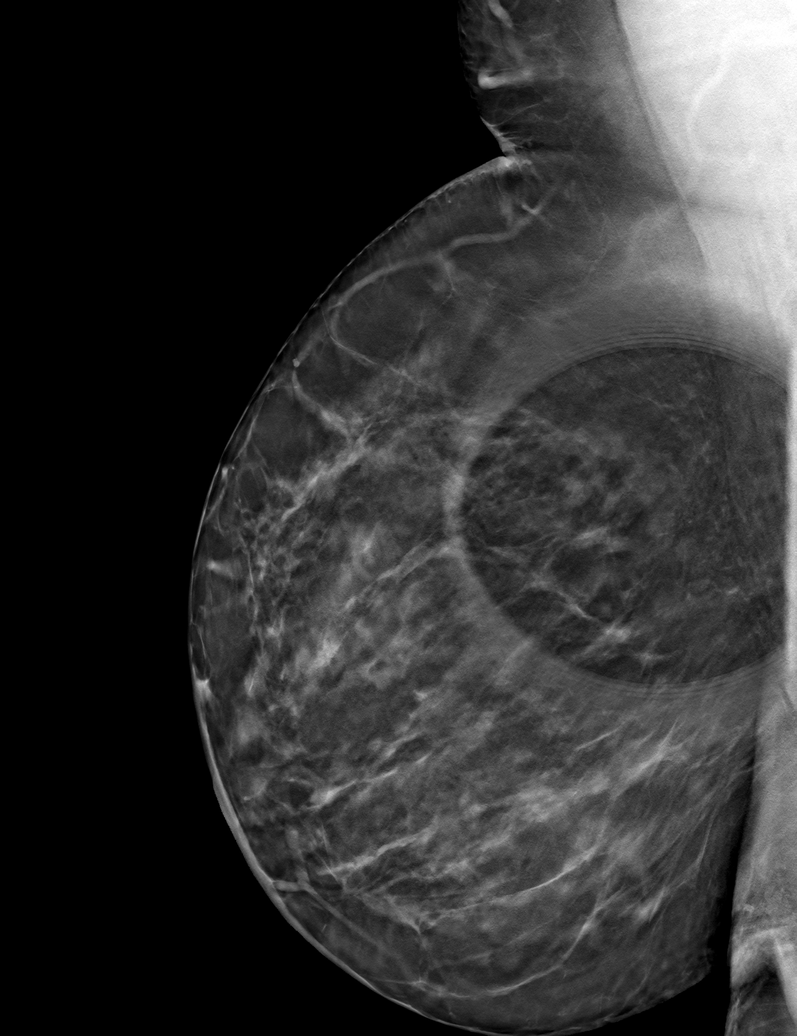

[R MLO tomo (2 of 2) · tomo slice 43/86.0]
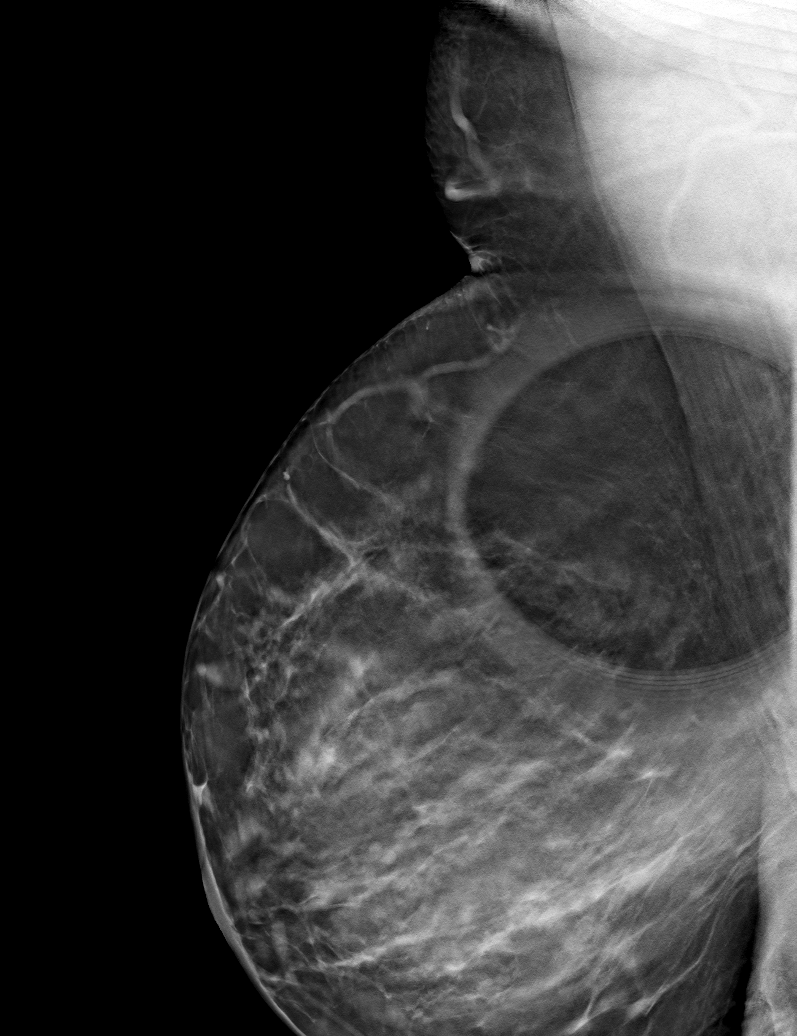

[6 of 18 positions shown; findings below may reference images not displayed]

ACR Breast Density Category c: The breast tissue is heterogeneously
dense, which may obscure small masses.
FINDINGS: Previously described, possible asymmetry in the far posterior right
breast seen on the MLO projection partially resolves on today's
additional views. It is no longer identified on the spot compression
views, but persists on the ML projection only. Further evaluation
with ultrasound was performed.

Mammographic images were processed with CAD.

Targeted ultrasound is performed, showing normal fibroglandular
tissue without focal or suspicious sonographic abnormality.
Evaluation of the entire upper outer right breast was performed.
IMPRESSION: Probably benign right breast asymmetry without ultrasound correlate.
Recommendation is for short-term mammographic follow-up.

RECOMMENDATION:
Diagnostic right breast mammogram in 6 months.

I have discussed the findings and recommendations with the patient.
If applicable, a reminder letter will be sent to the patient
regarding the next appointment.

BI-RADS CATEGORY  3: Probably benign.

## 2022-10-19 ENCOUNTER — Ambulatory Visit
Admission: RE | Admit: 2022-10-19 | Discharge: 2022-10-19 | Disposition: A | Discharge status: Home / Self Care | Payer: BC Managed Care – PPO | Source: Ambulatory Visit | Attending: Obstetrics and Gynecology | Admitting: Obstetrics and Gynecology

## 2022-10-19 DIAGNOSIS — N6489 Other specified disorders of breast: Secondary | ICD-10-CM

## 2023-06-24 ENCOUNTER — Ambulatory Visit (HOSPITAL_COMMUNITY)
Admission: RE | Admit: 2023-06-24 | Discharge: 2023-06-24 | Disposition: A | Discharge status: Home / Self Care | Payer: 59 | Source: Ambulatory Visit | Attending: Family Medicine | Admitting: Family Medicine

## 2023-06-24 ENCOUNTER — Other Ambulatory Visit (HOSPITAL_COMMUNITY): Payer: Self-pay | Admitting: Family Medicine

## 2023-06-24 DIAGNOSIS — R053 Chronic cough: Secondary | ICD-10-CM

## 2023-09-17 ENCOUNTER — Other Ambulatory Visit (HOSPITAL_COMMUNITY): Payer: Self-pay | Admitting: Radiology

## 2023-09-17 DIAGNOSIS — R053 Chronic cough: Secondary | ICD-10-CM

## 2023-10-06 NOTE — Progress Notes (Signed)
 MORIYA MITCHELL, female    DOB: 24-Feb-1966    MRN: [id number]   Brief patient profile:  58  yowf  never smoker with CAP age 58  with recurrent "bronchitis" as adult but not since covid when had covid x 2   referred to pulmonary clinic in [place]  10/09/2023 by Harriet Limber  NP for cough since end of November 2024    Had advair per Ena Harries prior to this but doesn't remember if hleped   ENT w/u 08/13/23  neg laryngoscope ([facility])   Pt not previously seen by PCCM service.    History of Present Illness  10/09/2023  Pulmonary/ 1st office eval/ Fawaz Borquez / [place] [facility] on pantoprazole  bid per egd instructions plus pepcid at hs  Chief Complaint  Patient presents with   Establish Care   Cough  Dyspnea:  breathing is fine if not coughing /  Cough: sensation of globus / does start coughing after sits up in am  then worst 1st thing but does not wake her up ina m / immediately when lie down / better with jolly ranchers / gum s mint  Sleep: flat bed bed one pillow cough every night  SABA use: every 4-6 hours  Gen chest/abd  discomfort when coughing violently    No obvious day to day or daytime pattern/variability or assoc excess/ purulent sputum or mucus plugs or hemoptysis or  chest tightness, subjective wheeze or overt sinus or hb symptoms.    Also denies any obvious fluctuation of symptoms with weather or environmental changes or other aggravating or alleviating factors except as outlined above   No unusual exposure hx or   or knowledge of premature birth.  Current Allergies, Complete Past Medical History, Past Surgical History, Family History, and Social History were reviewed in [employer] record.   ROS  The following are not active complaints unless bolded Hoarseness, sore throat, dysphagia, dental problems, itching, sneezing,  nasal congestion or discharge of excess mucus or purulent secretions, ear ache,   fever, chills, sweats, unintended wt loss or wt  gain, classically  exertional cp,  orthopnea pnd or arm/hand swelling  or leg swelling, presyncope, palpitations, abdominal pain, anorexia, nausea, vomiting, diarrhea  or change in bowel habits or change in bladder habits, change in stools or change in urine, dysuria, hematuria,  rash, arthralgias, visual complaints, headache, numbness, weakness or ataxia or problems with walking or coordination,  change in mood or  memory.            Outpatient Medications Prior to Visit  Medication Sig Dispense Refill   albuterol  (VENTOLIN  HFA) 108 (90 Base) MCG/ACT inhaler Inhale into the lungs every 6 (six) hours as needed for wheezing or shortness of breath.     atorvastatin  (LIPITOR) 20 MG tablet Take 20 mg by mouth daily.     calcium -vitamin D  (OSCAL WITH D) 500-5 MG-MCG tablet Take 1 tablet by mouth.     citalopram  (CELEXA ) 40 MG tablet Take 1 tablet (40 mg total) by mouth daily. 90 tablet 0   Dextromethorphan HBr (CREOMULSION ADULT PO) Take by mouth.     Dextromethorphan HBr (DELSYM) 15 MG TABS Take by mouth.     dicyclomine  (BENTYL ) 10 MG capsule Take 2 capsules (20 mg total) by mouth 4 (four) times daily. Take 30-60 minutes prior to meals 240 capsule 2   docusate sodium  (STOOL SOFTENER) 100 MG capsule Take 1 capsule (100 mg total) by mouth daily. 30 capsule 0  0   famotidine (PEPCID) 40 MG tablet Take 40 mg by mouth daily.            hydrochlorothiazide  (HYDRODIURIL ) 25 MG tablet Take 1 tablet (25 mg total) by mouth daily. 90 tablet 0   JARDIANCE 25 MG TABS tablet Take 25 mg by mouth daily.     levocetirizine (XYZAL) 5 MG tablet Take 5 mg by mouth daily.     linaclotide  (LINZESS ) 145 MCG CAPS capsule Take 1 capsule (145 mcg total) by mouth daily before breakfast. No refills can be provided without an appt (Patient taking differently: Take 145 mcg by mouth 3 (three) times a week.) 30 capsule 0   losartan  (COZAAR ) 50 MG tablet Take 1 tablet by mouth once daily 90 tablet 0   meclizine  (ANTIVERT )  12.5 MG tablet Take 1 tablet (12.5 mg total) by mouth 3 (three) times daily as needed for dizziness. 30 tablet 2   Melatonin 1 MG CAPS Take 1 mg by mouth at bedtime.     metFORMIN  (GLUCOPHAGE -XR) 500 MG 24 hr tablet Take 2 tablets by mouth twice daily 360 tablet 0   methocarbamol (ROBAXIN) 500 MG tablet Take 1 tablet by mouth at bedtime.     mirabegron  ER (MYRBETRIQ ) 50 MG TB24 tablet Take 1 tablet (50 mg total) by mouth daily. 30 tablet 11   MOUNJARO 15 MG/0.5ML Pen Inject 15 mg into the skin daily.     NP THYROID  120 MG tablet TAKE 1 TABLET BY MOUTH BEFORE BREAKFAST (Patient taking differently: Take 120 mg by mouth daily before breakfast.) 30 tablet 3   NP THYROID  60 MG tablet Take 1 tablet (60 mg total) by mouth daily before breakfast. 30 tablet 3   olopatadine (PATANOL) 0.1 % ophthalmic solution Place 1 drop into both eyes 2 (two) times daily as needed for allergies.     ondansetron  (ZOFRAN ) 4 MG tablet Take 1 tablet (4 mg total) by mouth every 8 (eight) hours as needed for nausea or vomiting. 30 tablet 0   pantoprazole  (PROTONIX ) 40 MG tablet Take 1 tablet (40 mg total) by mouth 2 (two) times daily. 180 tablet 3   potassium chloride SA (KLOR-CON) 20 MEQ tablet Take 2 tablets by mouth once daily 180 tablet 0   predniSONE  (DELTASONE ) 20 MG tablet TAKE 2 TABLETS BY MOUTH ONCE DAILY WITH BREAKFAST 10 tablet 0          Soft Lens Products (BIOTRUE) SOLN Place 1 drop into both eyes at bedtime.     tamsulosin  (FLOMAX ) 0.4 MG CAPS capsule Take 1 capsule (0.4 mg total) by mouth daily after supper. 30 capsule 6   AUVELITY 45-105 MG TBCR Take by mouth. (Patient not taking: Reported on 10/09/2023)     Cholecalciferol (VITAMIN D -3) 125 MCG (5000 UT) TABS Take 5,000 Units by mouth in the morning and at bedtime.  (Patient not taking: Reported on 10/09/2023)     Dulaglutide  (TRULICITY ) 3 MG/0.5ML SOPN Inject 3 mg into the skin once a week. 2 mL 3      Past Medical History:  Diagnosis Date   Allergic  rhinitis    Anxiety    Axillary mass 08/2012   left   Colon polyps    Dental crowns present    Diabetes mellitus    NIDDM   Diverticulosis of sigmoid colon 08/18/2012   and descending colon   Gallstones    GERD (gastroesophageal reflux disease)    seldom   Hemorrhoids    High triglycerides  History of kidney stones    Hypertension    under control with meds., has been on med. x 5-6 yr.   IBS (irritable bowel syndrome)    Interstitial cystitis    has had no problems in 25 yr.   NASH (nonalcoholic steatohepatitis)    PCOS (polycystic ovarian syndrome)    Pneumonia       Objective:     BP 123/78 (BP Location: Left Arm)   Pulse 87   Ht 5\' 4"  (1.626 m)   Wt 190 lb 12.8 oz (86.5 kg)   SpO2 94% Comment: RA  BMI 32.75 kg/m   SpO2: 94 % (RA) amb mod obese (by BMI) wf incessant dry cough    HEENT : Oropharynx  clear      Nasal turbinates mod edema   NECK :  without  apparent JVD/ palpable Nodes/TM    LUNGS: no acc muscle use,  Nl contour chest which is clear to A and P bilaterally without cough on insp or exp maneuvers   CV:  RRR  no s3 or murmur or increase in P2, and no edema   ABD:  soft and nontender   MS:  Gait nl   ext warm without deformities Or obvious joint restrictions  calf tenderness, cyanosis or clubbing    SKIN: warm and dry without lesions    NEURO:  alert, approp, nl sensorium with  no motor or cerebellar deficits apparent.    I personally reviewed images and agree with radiology impression as follows:  CXR:   pa and lateral 06/24/23  Wnl     Assessment   Upper airway cough syndrome Onset was late Nov 2024 not responsive to max gerd rx - cyclical cough protocol with pred x 6 days and promethazine DM/ tramadol   - Allergy screen 10/09/2023 >  Eos 0. /  IgE    Upper airway cough syndrome (previously labeled PNDS),  is so named because it's frequently impossible to sort out how much is  CR/sinusitis with freq throat clearing (which can be related  to primary GERD)   vs  causing  secondary (" extra esophageal")  GERD from wide swings in gastric pressure that occur with throat clearing, often  promoting self use of mint and menthol lozenges that reduce the lower esophageal sphincter tone and exacerbate the problem further in a cyclical fashion.   These are the same pts (now being labeled as having "irritable larynx syndrome" by some cough centers) who not infrequently have a history of having failed to tolerate ace inhibitors,  dry powder inhalers or biphosphonates or report having atypical/extraesophageal reflux symptoms that don't respond to standard doses of PPI  and are easily confused as having aecopd or asthma flares by even experienced allergists/ pulmonologists (myself included).   Of the three most common causes of  Sub-acute / recurrent or chronic cough, only one (GERD)  can actually contribute to/ trigger  the other two (asthma and post nasal drip syndrome)  and perpetuate the cylce of cough.  While not intuitively obvious, many patients with chronic low grade reflux do not cough until there is a primary insult that disturbs the protective epithelial barrier and exposes sensitive nerve endings.   This is typically viral but can due to PNDS and  either may apply here.   >>>   The point is that once this occurs, it is difficult to eliminate the cycle  using anything but a maximally effective acid suppression regimen at least in the  short run, accompanied by an appropriate diet to address non acid GERD and control / eliminate the cough itself for at least 3 days with tramadol , eliminate pnds with 1st gen H1 blockers per guidelines  >>> also added 6 day taper off  Prednisone  starting at 40 mg per day in case of component of Th-2 driven upper or lower airways inflammation (if cough responds short term only to relapse before return while will on full rx for uacs (as above), then that would point to allergic rhinitis/ asthma or eos bronchitis as  alternative dx)    Advised: The standardized cough guidelines published in Chest by Slater Duncan in 2006 are still the best available and consist of a multiple step process (up to 12!) , not a single office visit,  and are intended  to address this problem logically,  with an alogrithm dependent on response to empiric treatment at  each progressive step  to determine a specific diagnosis with  minimal addtional testing needed. Therefore if adherence is an issue or can't be accurately verified,  it's very unlikely the standard evaluation and treatment will be successful here.    Furthermore, response to therapy (other than acute cough suppression, which should only be used short term with avoidance of narcotic containing cough syrups if possible), can be a gradual process for which the patient is not likely to  perceive immediate benefit.  Unlike going to an eye doctor where the best perscription is almost always the first one and is immediately effective, this is almost never the case in the management of chronic cough syndromes. Therefore the patient needs to commit up front to consistently adhere to recommendations  for up to 6 weeks of therapy directed at the likely underlying problem(s) before the response can be reasonably evaluated.   F/u 4 weeks with all meds in hand using a trust but verify approach to confirm accurate Medication  Reconciliation The principal here is that until we are certain that the  patients are doing what we've asked, it makes no sense to ask them to do more.           Each maintenance medication was reviewed in detail including emphasizing most importantly the difference between maintenance and prns and under what circumstances the prns are to be triggered using an action plan format where appropriate.  Total time for H and P, chart review, counseling, reviewing hfa device(s) and generating customized AVS unique to this office visit / same day charting = 50 min for new  pt with chronic refractory respiratory  symptoms of uncertain etiology           Vernestine Gondola, MD 10/09/2023

## 2023-10-09 ENCOUNTER — Encounter: Payer: Self-pay | Admitting: Internal Medicine

## 2023-10-09 ENCOUNTER — Ambulatory Visit: Admitting: Internal Medicine

## 2023-10-09 ENCOUNTER — Ambulatory Visit: Payer: Self-pay | Admitting: Internal Medicine

## 2023-10-09 VITALS — BP 123/78 | HR 87 | Ht 64.0 in | Wt 190.8 lb

## 2023-10-09 DIAGNOSIS — R058 Other specified cough: Secondary | ICD-10-CM | POA: Insufficient documentation

## 2023-10-09 MED ORDER — TRAMADOL HCL 50 MG PO TABS: 50.0000 mg | ORAL_TABLET | ORAL | 0 refills | Status: AC | PRN

## 2023-10-09 MED ORDER — PROMETHAZINE-DM 6.25-15 MG/5ML PO SYRP: 5.0000 mL | ORAL_SOLUTION | Freq: Four times a day (QID) | ORAL | 0 refills | Status: DC | PRN

## 2023-10-09 MED ORDER — PREDNISONE 10 MG PO TABS: ORAL_TABLET | ORAL | 0 refills | Status: DC

## 2023-10-09 NOTE — Patient Instructions (Addendum)
 The key to effective treatment for your cough is eliminating the non-stop cycle of cough you're stuck in long enough to let your airway heal completely and then see if there is anything still making you cough once you stop the cough suppression, but this should take no more than 5 days to figure out  First take promethazine DM up to 4 x daily 1 tsp  and supplement if needed with  tramadol  50 mg up to 2 every 4 hours to suppress the urge to cough at all or even clear your throat. Swallowing water or using ice chips/non mint and menthol containing candies (such as lifesavers or sugarless jolly ranchers) are also effective.  You should rest your voice and avoid activities that you know make you cough.  Once you have eliminated the cough for 3 straight days try reducing the tramadol  first,  then the prometazine as tolerated.    Prednisone  10 mg take  4 each am x 2 days,   2 each am x 2 days,  1 each am x 2 days and stop (this is to eliminate allergies and inflammation from coughing)  Protonix  (pantoprazole ) Take 30-60 min before first meal of the day and Pepcid 20 mg one bedtime plus chlorpheniramine 4 mg x 2 at bedtime (both available over the counter)  until cough is completely gone for at least a week without the need for cough suppression  GERD (REFLUX)  is an extremely common cause of respiratory symptoms, many times with no significant heartburn at all.    It can be treated with medication, but also with lifestyle changes including avoidance of late meals, excessive alcohol, smoking cessation, and avoid fatty foods, chocolate, peppermint, colas, red wine, and acidic juices such as orange juice.  NO MINT OR MENTHOL PRODUCTS SO NO COUGH DROPS  USE HARD CANDY INSTEAD (jolley ranchers or Stover's or Lifesavers (all available in sugarless versions) NO OIL BASED VITAMINS - use powdered substitutes.  Stop the xyzal for now if you can take the chlorpheniramine in daytime      Please remember to go to  the lab department   for your tests - we will call you with the results when they are available.

## 2023-10-10 NOTE — Assessment & Plan Note (Addendum)
 Onset was late Nov 2024 not responsive to max gerd rx - cyclical cough protocol with pred x 6 days and promethazine DM/ tramadol   - Allergy screen 10/09/2023 >  Eos 0. /  IgE    Upper airway cough syndrome (previously labeled PNDS),  is so named because it's frequently impossible to sort out how much is  CR/sinusitis with freq throat clearing (which can be related to primary GERD)   vs  causing  secondary (" extra esophageal")  GERD from wide swings in gastric pressure that occur with throat clearing, often  promoting self use of mint and menthol lozenges that reduce the lower esophageal sphincter tone and exacerbate the problem further in a cyclical fashion.   These are the same pts (now being labeled as having "irritable larynx syndrome" by some cough centers) who not infrequently have a history of having failed to tolerate ace inhibitors,  dry powder inhalers or biphosphonates or report having atypical/extraesophageal reflux symptoms that don't respond to standard doses of PPI  and are easily confused as having aecopd or asthma flares by even experienced allergists/ pulmonologists (myself included).   Of the three most common causes of  Sub-acute / recurrent or chronic cough, only one (GERD)  can actually contribute to/ trigger  the other two (asthma and post nasal drip syndrome)  and perpetuate the cylce of cough.  While not intuitively obvious, many patients with chronic low grade reflux do not cough until there is a primary insult that disturbs the protective epithelial barrier and exposes sensitive nerve endings.   This is typically viral but can due to PNDS and  either may apply here.   >>>   The point is that once this occurs, it is difficult to eliminate the cycle  using anything but a maximally effective acid suppression regimen at least in the short run, accompanied by an appropriate diet to address non acid GERD and control / eliminate the cough itself for at least 3 days with tramadol ,  eliminate pnds with 1st gen H1 blockers per guidelines  >>> also added 6 day taper off  Prednisone  starting at 40 mg per day in case of component of Th-2 driven upper or lower airways inflammation (if cough responds short term only to relapse before return while will on full rx for uacs (as above), then that would point to allergic rhinitis/ asthma or eos bronchitis as alternative dx)    Advised: The standardized cough guidelines published in Chest by Slater Duncan in 2006 are still the best available and consist of a multiple step process (up to 12!) , not a single office visit,  and are intended  to address this problem logically,  with an alogrithm dependent on response to empiric treatment at  each progressive step  to determine a specific diagnosis with  minimal addtional testing needed. Therefore if adherence is an issue or can't be accurately verified,  it's very unlikely the standard evaluation and treatment will be successful here.    Furthermore, response to therapy (other than acute cough suppression, which should only be used short term with avoidance of narcotic containing cough syrups if possible), can be a gradual process for which the patient is not likely to  perceive immediate benefit.  Unlike going to an eye doctor where the best perscription is almost always the first one and is immediately effective, this is almost never the case in the management of chronic cough syndromes. Therefore the patient needs to commit up front to consistently  adhere to recommendations  for up to 6 weeks of therapy directed at the likely underlying problem(s) before the response can be reasonably evaluated.   F/u 4 weeks with all meds in hand using a trust but verify approach to confirm accurate Medication  Reconciliation The principal here is that until we are certain that the  patients are doing what we've asked, it makes no sense to ask them to do more.           Each maintenance medication was  reviewed in detail including emphasizing most importantly the difference between maintenance and prns and under what circumstances the prns are to be triggered using an action plan format where appropriate.  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

## 2023-10-11 ENCOUNTER — Ambulatory Visit: Payer: Self-pay | Admitting: Internal Medicine

## 2023-10-11 LAB — CBC WITH DIFFERENTIAL/PLATELET
Basophils Absolute: 0.1 10*3/uL (ref 0.0–0.2)
Basos: 1 %
EOS (ABSOLUTE): 0.6 10*3/uL — ABNORMAL HIGH (ref 0.0–0.4)
Eos: 7 %
Hematocrit: 43.3 % (ref 34.0–46.6)
Hemoglobin: 14.7 g/dL (ref 11.1–15.9)
Immature Grans (Abs): 0 10*3/uL (ref 0.0–0.1)
Immature Granulocytes: 0 %
Lymphocytes Absolute: 2.9 10*3/uL (ref 0.7–3.1)
Lymphs: 36 %
MCH: 29.2 pg (ref 26.6–33.0)
MCHC: 33.9 g/dL (ref 31.5–35.7)
MCV: 86 fL (ref 79–97)
Monocytes Absolute: 0.7 10*3/uL (ref 0.1–0.9)
Monocytes: 9 %
Neutrophils Absolute: 3.9 10*3/uL (ref 1.4–7.0)
Neutrophils: 47 %
Platelets: 297 10*3/uL (ref 150–450)
RBC: 5.04 x10E6/uL (ref 3.77–5.28)
RDW: 12.4 % (ref 11.7–15.4)
WBC: 8.1 10*3/uL (ref 3.4–10.8)

## 2023-10-11 LAB — IGE: IgE (Immunoglobulin E), Serum: 94 [IU]/mL (ref 6–495)

## 2023-10-15 ENCOUNTER — Telehealth: Payer: Self-pay

## 2023-10-15 NOTE — Telephone Encounter (Signed)
 Please advise, Thanks!

## 2023-10-15 NOTE — Telephone Encounter (Signed)
 Copied from CRM [id number]. Topic: Clinical - Medication Question >> Oct 15, 2023  9:02 AM Hilton Lucky wrote: Reason for CRM: Patient seen last week.Patient states cough is still persisting and has followed directions thus far. Patient requesting additional medication previously discussed at last appointment.

## 2023-10-15 NOTE — Telephone Encounter (Signed)
 Spoke with patient and informed and she confirmed her appt scheduled for 10/24/23. Advised to bring all medications to appt.

## 2023-10-15 NOTE — Telephone Encounter (Signed)
 She may be referring to gabapentin but I did not plan to start it until returns with all meds in hand using a trust but verify approach to confirm accurate Medication  Reconciliation  - ok to add on to any day in the next 2 weeks at the last slot of the day

## 2023-10-20 NOTE — Progress Notes (Unsigned)
 Isabella Juarez, female    DOB: 07/11/1965    MRN: [id number]   Brief patient profile:  7  yowf  never smoker with CAP age 58  with recurrent "bronchitis" as adult but not since covid when had covid x 2   referred to pulmonary clinic in [place]  10/09/2023 by Harriet Limber  NP for cough since end of November 2024    Had advair per Ena Harries prior to this but doesn't remember if hleped   ENT w/u 08/13/23  neg laryngoscope ([facility])   Pt not previously seen by PCCM service.    History of Present Illness  10/09/2023  Pulmonary/ 1st office eval/ Mckade Gurka / [place] [facility] on pantoprazole  bid per egd instructions plus pepcid at hs  Chief Complaint  Patient presents with   Establish Care   Cough  Dyspnea:  breathing is fine if not coughing /  Cough: sensation of globus / does start coughing after sits up in am  then worst 1st thing but does not wake her up ina m / immediately when lie down / better with jolly ranchers / gum s mint  Sleep: flat bed bed one pillow cough every night  SABA use: every 4-6 hours  Gen chest/abd  discomfort when coughing violently Rec First take promethazine  DM up to 4 x daily 1 tsp  and supplement if needed with  tramadol  50 mg up to 2 every 4 hours to suppress the urge to cough at all or even clear your throat.  Once you have eliminated the cough for 3 straight days try reducing the tramadol  first,  then the prometazine as tolerated.   Prednisone  10 mg take  4 each am x 2 days,   2 each am x 2 days,  1 each am x 2 days and stop (this is to eliminate allergies and inflammation from coughing) Protonix  (pantoprazole ) Take 30-60 min before first meal of the day and Pepcid 20 mg one bedtime plus chlorpheniramine 4 mg x 2 at bedtime (both available over the counter)  until cough is completely gone for at least a week without the need for cough suppression GERD diet reviewed, bed blocks rec  Stop the xyzal for now if you can take the chlorpheniramine in daytime     10/24/2023  f/u ov/[place] [facility] re: cough x Nov 2024 with  maint on gerd rx   Chief Complaint  Patient presents with   Follow-up   Cough  Dyspnea:  better as was the cough while on pred Cough: harsh with throat clearing  Sleeping: bed blocks x 8 in  with 2 pillows and cough fort first 5- 10 min with green mucus nightly then off and on noct but much better SABA use: none  02: none     No obvious day to day or daytime variability or assoc  mucus plugs or hemoptysis or cp or chest tightness, subjective wheeze or overt sinus or hb symptoms.    Also denies any obvious fluctuation of symptoms with weather or environmental changes or other aggravating or alleviating factors except as outlined above   No unusual exposure hx or h/o childhood pna/ asthma or knowledge of premature birth.  Current Allergies, Complete Past Medical History, Past Surgical History, Family History, and Social History were reviewed in [employer] record.  ROS  The following are not active complaints unless bolded Hoarseness, sore throat/globus  dysphagia, dental problems, itching, sneezing,  nasal congestion or discharge of excess mucus or purulent secretions,  ear ache,   fever, chills, sweats, unintended wt loss or wt gain, classically pleuritic or exertional cp,  orthopnea pnd or arm/hand swelling  or leg swelling, presyncope, palpitations, abdominal pain, anorexia, nausea, vomiting, diarrhea  or change in bowel habits or change in bladder habits, change in stools or change in urine, dysuria, hematuria,  rash, arthralgias, visual complaints, headache, numbness, weakness or ataxia or problems with walking or coordination,  change in mood or  memory.        Current Meds  Medication Sig   atorvastatin  (LIPITOR) 20 MG tablet Take 20 mg by mouth daily.   calcium -vitamin D  (OSCAL WITH D) 500-5 MG-MCG tablet Take 1 tablet by mouth.   citalopram  (CELEXA ) 40 MG tablet Take 1 tablet (40 mg  total) by mouth daily.   dexamethasone  (DECADRON ) 0.5 MG/5ML solution 0.5 mg.   dicyclomine  (BENTYL ) 10 MG capsule Take 2 capsules (20 mg total) by mouth 4 (four) times daily. Take 30-60 minutes prior to meals   docusate sodium  (STOOL SOFTENER) 100 MG capsule Take 1 capsule (100 mg total) by mouth daily.   famotidine (PEPCID) 40 MG tablet Take 40 mg by mouth daily.   hydrochlorothiazide  (HYDRODIURIL ) 25 MG tablet Take 1 tablet (25 mg total) by mouth daily.   JARDIANCE 25 MG TABS tablet Take 25 mg by mouth daily.   levocetirizine (XYZAL) 5 MG tablet Take 5 mg by mouth daily.   linaclotide  (LINZESS ) 145 MCG CAPS capsule Take 1 capsule (145 mcg total) by mouth daily before breakfast. No refills can be provided without an appt (Patient taking differently: Take 145 mcg by mouth 3 (three) times a week.)   losartan  (COZAAR ) 50 MG tablet Take 1 tablet by mouth once daily   meclizine  (ANTIVERT ) 12.5 MG tablet Take 1 tablet (12.5 mg total) by mouth 3 (three) times daily as needed for dizziness.   Melatonin 1 MG CAPS Take 1 mg by mouth at bedtime.   metFORMIN  (GLUCOPHAGE -XR) 500 MG 24 hr tablet Take 2 tablets by mouth twice daily   methocarbamol (ROBAXIN) 500 MG tablet Take 1 tablet by mouth at bedtime.   mirabegron  ER (MYRBETRIQ ) 50 MG TB24 tablet Take 1 tablet (50 mg total) by mouth daily.   MOUNJARO 15 MG/0.5ML Pen Inject 15 mg into the skin daily.   NP THYROID  120 MG tablet TAKE 1 TABLET BY MOUTH BEFORE BREAKFAST (Patient taking differently: Take 120 mg by mouth daily before breakfast.)   NP THYROID  60 MG tablet Take 1 tablet (60 mg total) by mouth daily before breakfast.   olopatadine (PATANOL) 0.1 % ophthalmic solution Place 1 drop into both eyes 2 (two) times daily as needed for allergies.   ondansetron  (ZOFRAN ) 4 MG tablet Take 1 tablet (4 mg total) by mouth every 8 (eight) hours as needed for nausea or vomiting.   pantoprazole  (PROTONIX ) 40 MG tablet Take 1 tablet (40 mg total) by mouth 2 (two)  times daily.   potassium chloride SA (KLOR-CON) 20 MEQ tablet Take 2 tablets by mouth once daily   predniSONE  (DELTASONE ) 10 MG tablet Take  4 each am x 2 days,   2 each am x 2 days,  1 each am x 2 days and stop   promethazine -dextromethorphan (PROMETHAZINE -DM) 6.25-15 MG/5ML syrup Take 5 mLs by mouth 4 (four) times daily as needed for cough.   Soft Lens Products (BIOTRUE) SOLN Place 1 drop into both eyes at bedtime.   tamsulosin  (FLOMAX ) 0.4 MG CAPS capsule Take 1 capsule (0.4 mg  total) by mouth daily after supper.             Past Medical History:  Diagnosis Date   Allergic rhinitis    Anxiety    Axillary mass 08/2012   left   Colon polyps    Dental crowns present    Diabetes mellitus    NIDDM   Diverticulosis of sigmoid colon 08/18/2012   and descending colon   Gallstones    GERD (gastroesophageal reflux disease)    seldom   Hemorrhoids    High triglycerides    History of kidney stones    Hypertension    under control with meds., has been on med. x 5-6 yr.   IBS (irritable bowel syndrome)    Interstitial cystitis    has had no problems in 25 yr.   NASH (nonalcoholic steatohepatitis)    PCOS (polycystic ovarian syndrome)    Pneumonia       Objective:     Wt Readings from Last 3 Encounters:  10/24/23 188 lb 12.8 oz (85.6 kg)  10/09/23 190 lb 12.8 oz (86.5 kg)  09/07/20 215 lb (97.5 kg)      Vital signs reviewed  10/24/2023  - Note at rest 02 sats  96% on RA   General appearance:    amb wf/harsh cough, freq thraot clearing    HEENT : Oropharynx  clear      Nasal turbinates no purulent secretions    NECK :  without  apparent JVD/ palpable Nodes/TM    LUNGS: no acc muscle use,  Nl contour chest which is clear to A and P bilaterally with  cough on  exp maneuvers   CV:  RRR  no s3 or murmur or increase in P2, and no edema   ABD:  soft and nontender   MS:  Gait nl   ext warm without deformities Or obvious joint restrictions  calf tenderness, cyanosis or  clubbing    SKIN: warm and dry without lesions    NEURO:  alert, approp, nl sensorium with  no motor or cerebellar deficits apparent.        Assessment

## 2023-10-24 ENCOUNTER — Encounter: Payer: Self-pay | Admitting: Internal Medicine

## 2023-10-24 ENCOUNTER — Ambulatory Visit: Admitting: Internal Medicine

## 2023-10-24 VITALS — BP 136/89 | HR 92 | Ht <= 58 in | Wt 188.8 lb

## 2023-10-24 DIAGNOSIS — R058 Other specified cough: Secondary | ICD-10-CM

## 2023-10-24 MED ORDER — AIRSUPRA 90-80 MCG/ACT IN AERO: 2.0000 | INHALATION_SPRAY | RESPIRATORY_TRACT | 11 refills | Status: AC | PRN

## 2023-10-24 MED ORDER — PROMETHAZINE-DM 6.25-15 MG/5ML PO SYRP: 5.0000 mL | ORAL_SOLUTION | Freq: Four times a day (QID) | ORAL | 0 refills | Status: DC | PRN

## 2023-10-24 MED ORDER — AMOXICILLIN-POT CLAVULANATE 875-125 MG PO TABS: 1.0000 | ORAL_TABLET | Freq: Two times a day (BID) | ORAL | 0 refills | Status: DC

## 2023-10-24 MED ORDER — PREDNISONE 10 MG PO TABS: ORAL_TABLET | ORAL | 0 refills | Status: DC

## 2023-10-24 NOTE — Patient Instructions (Addendum)
 Air supra in 2 puffs four times daily and taper off after a  week   Work on inhaler technique:  relax and gently blow all the way out then take a nice smooth full deep breath back in, triggering the inhaler at same time you start breathing in.  Hold breath in for at least  5 seconds if you can. Blow out air supr  thru nose. Rinse and gargle with water when done.  If mouth or throat bother you at all,  try brushing teeth/gums/tongue with arm and hammer toothpaste/ make a slurry and gargle and spit out.       Prednisone  10 mg take  4 each am x 2 days,   2 each am x 2 days,  1 each am x 2 days and stop    Augmentin  875 mg take one pill twice daily  X 10 days - take at breakfast and supper with large glass of water.  It would help reduce the usual side effects (diarrhea and yeast infections) if you ate cultured yogurt at lunch.   For cough > promethazine  1 tsp four times daily as needed   For drainage / throat tickle try take CHLORPHENIRAMINE  4 mg  ("Allergy Relief" 4mg   at [facility] should be easiest to find in the blue box usually on bottom shelf)  take one every 4 hours as needed - extremely effective and inexpensive over the counter- may cause drowsiness so start with just a dose or two an hour before bedtime and see how you tolerate it before trying in daytime.   Call if not better for the next stop

## 2023-10-25 NOTE — Assessment & Plan Note (Addendum)
 Onset was late Nov 2024 not responsive to max gerd rx - cyclical cough protocol with pred x 6 days and promethazine  DM/ tramadol   - Allergy screen 10/09/2023 >  Eos 0.6 /  IgE  94 - 10/24/2023  After extensive coaching inhaler device,  effectiveness =  75% > try air supra plus augmentin  and 6 d of pred / 1st gen H1 blockers per guidelines  / then add gabapentin trial if still coughing   I suspect this cough as is the case with most chronic cough is multfactorial with green mucus in am suggestive of sinusitis vs bronchitis or sinusitis leading to bronchitis with reported response to prednisone  and cough at end exp typical of AB   Rec Trial of airwsupra/ augmentin  and suppression of throat clearing by using hard rock candy and continue with daytime 1st gen H1 blockers per guidelines  if tol  with gabapentin the next step   Discussed in detail all the  indications, usual  risks and alternatives  relative to the benefits with patient who agrees to proceed with Rx as outlined.             Each maintenance medication was reviewed in detail including emphasizing most importantly the difference between maintenance and prns and under what circumstances the prns are to be triggered using an action plan format where appropriate.  Total time for H and P, chart review, counseling, reviewing hfa  device(s) and generating customized AVS unique to this office visit / same day charting = 42 min for   refractory respiratory  symptoms of uncertain etiology

## 2023-11-13 NOTE — Telephone Encounter (Signed)
 Copied from CRM [id number]. Topic: Clinical - Medical Advice >> Nov 11, 2023  3:17 PM Devaughn RAMAN wrote: Reason for CRM: Patient called and stated she was informed to follow up with Dr.Wert if her cough had not gotten better and he would follow up with a next course of treatment. Patient was thankful and verbalized understanding.  Routed to Dr wert he stated if not better to call for the next step.

## 2023-11-13 NOTE — Telephone Encounter (Signed)
 Copied from CRM [id number]. Topic: Clinical - Medical Advice >> Nov 08, 2023 10:12 AM Isabell A wrote: Reason for CRM: Patient was told to call back if her cough comes back & he will send her the next course of treatment. Cough hasn't gotten worse but came back and she's completed antibiotics and steroids.   Callback number: [phone]   DR Darlean you told pt the call if not better for the next stop at last OV.

## 2023-11-15 MED ORDER — GABAPENTIN 100 MG PO CAPS: ORAL_CAPSULE | ORAL | 1 refills | Status: DC

## 2023-11-15 NOTE — Addendum Note (Signed)
 Addended by: RUFFUS LUKES T on: 11/15/2023 02:43 PM   Modules accepted: Orders

## 2023-11-15 NOTE — Telephone Encounter (Signed)
 Called [facility] in [place] gave verbal for [employer], spoke with Josh the pharmacist. NFN

## 2023-11-15 NOTE — Addendum Note (Signed)
 Addended by: RUFFUS LUKES T on: 11/15/2023 03:00 PM   Modules accepted: Orders

## 2023-11-15 NOTE — Telephone Encounter (Signed)
 Spoke with pt regarding her medication she confirmed her understanding. NFN

## 2023-11-25 ENCOUNTER — Other Ambulatory Visit: Payer: Self-pay | Admitting: Internal Medicine

## 2023-11-28 ENCOUNTER — Telehealth: Payer: Self-pay

## 2023-11-28 DIAGNOSIS — R058 Other specified cough: Secondary | ICD-10-CM

## 2023-11-28 NOTE — Telephone Encounter (Signed)
 Copied from CRM [id number]. Topic: Clinical - Medical Advice >> Nov 28, 2023 12:56 PM Joesph PARAS wrote: Reason for CRM: Patient states was on a treatment plan of antibiotics and steroids, states has been off it for between 5 and 7 days and cough has returned. Would like to know next steps for treatment.  Dr Darlean please advise

## 2023-11-28 NOTE — Telephone Encounter (Signed)
 Sinus CT dx chronic cough   If this is neg then allergy referral next step in the w/u

## 2023-11-29 ENCOUNTER — Other Ambulatory Visit: Payer: Self-pay

## 2023-11-29 DIAGNOSIS — R058 Other specified cough: Secondary | ICD-10-CM

## 2023-11-29 NOTE — Telephone Encounter (Signed)
 Spoke with pt regarding Dr Darlean recs, I placed an order for her sinus ct and told pt. If test are neg will reffer out to allergy. Confirmed understanding NFN.

## 2023-12-03 NOTE — Telephone Encounter (Signed)
 Copied from CRM [id number]. Topic: Clinical - Medical Advice >> Dec 03, 2023 10:37 AM Joesph PARAS wrote: Reason for CRM: Patient states that she has seen an ENT before and he ruled out issues with her sinuses and does not think the scan will be useful, as she's coughing up stuff. Patient concerned of cost for sinus CT and chest CT if needed. Please advise patient, states has clinical questions.  Called and tried speaking to the patient and she stated she would like to speak to previous lady who contacted her since she worked closely with Dr. Darlean Routing to [place] for follow-up, please advise pt concerns.

## 2023-12-05 ENCOUNTER — Ambulatory Visit: Admitting: Internal Medicine

## 2023-12-10 NOTE — Telephone Encounter (Signed)
 Spoke with pt regarding her ct sinues scan she does not want to do that she rather be referred out to allergist. Is that okay Dr Darlean?

## 2023-12-10 NOTE — Telephone Encounter (Signed)
 Left detailed message regarding dr leisa recs, referring out to allergy NFN

## 2023-12-10 NOTE — Addendum Note (Signed)
 Addended by: RUFFUS LUKES T on: 12/10/2023 04:56 PM   Modules accepted: Orders

## 2023-12-12 ENCOUNTER — Telehealth: Payer: Self-pay

## 2023-12-12 NOTE — Telephone Encounter (Signed)
 Copied from CRM #[id number]. Topic: Clinical - Lab/Test Results >> Dec 10, 2023  5:00 PM Rilla B wrote: Reason for CRM: Patient returning call to [facility]. Please call  Spoke with pt to inform that I have sent referral to allergy. NFN

## 2023-12-16 ENCOUNTER — Ambulatory Visit (HOSPITAL_COMMUNITY)

## 2023-12-28 ENCOUNTER — Other Ambulatory Visit: Payer: Self-pay | Admitting: Internal Medicine

## 2024-01-24 ENCOUNTER — Ambulatory Visit: Payer: Self-pay | Admitting: Allergy & Immunology

## 2024-01-24 ENCOUNTER — Encounter: Payer: Self-pay | Admitting: Allergy & Immunology

## 2024-01-24 ENCOUNTER — Other Ambulatory Visit: Payer: Self-pay

## 2024-01-24 VITALS — BP 120/72 | HR 91 | Temp 98.0°F | Resp 18 | Ht 62.99 in | Wt 182.4 lb

## 2024-01-24 DIAGNOSIS — J4540 Moderate persistent asthma, uncomplicated: Secondary | ICD-10-CM

## 2024-01-24 DIAGNOSIS — J310 Chronic rhinitis: Secondary | ICD-10-CM | POA: Diagnosis not present

## 2024-01-24 NOTE — Patient Instructions (Addendum)
 1. Moderate persistent asthma, uncomplicated - Lung testing looked fairly good, but did improve with albuterol  which is consistent with asthma. - This does not necessarily rule it in, but it points towards a diagnosis of asthma. - Reassuringly, the use of the AirSupra  seemed to be helpful.  - We are going to start you on a daily controller medication that can keep inflammation under control and help keep your lungs open throughout the day. - Sample of Breztri provided. - We will send it in if you think that this is helpful. - Spacer sample and demonstration provided. - Daily controller medication(s): Breztri 160/9/4.53mcg two puffs twice daily - Prior to physical activity: AirSupra  2 puffs 10-15 minutes before physical activity. - Rescue medications: AirSupra  2 puffs every 4-6 hours as needed - Asthma control goals:  * Full participation in all desired activities (may need albuterol  before activity) * Albuterol  use two time or less a week on average (not counting use with activity) * Cough interfering with sleep two time or less a month * Oral steroids no more than once a year * No hospitalizations  2. Chronic rhinitis - Because of insurance stipulations, we cannot do skin testing on the same day as your first visit. - We are all working to fight this, but for now we need to do two separate visits.  - We will know more after we do testing at the next visit.  - The skin testing visit can be squeezed in at your convenience.  - Then we can make a more full plan to address all of your symptoms. - Be sure to stop your antihistamines for 3 days before this appointment.   3. Return in about 1 week (around 01/31/2024) for SKIN TESTING (1-55). You can have the follow up appointment with Dr. Iva or a Nurse Practicioner (our Nurse Practitioners are excellent and always have Physician oversight!).    Please inform us  of any Emergency Department visits, hospitalizations, or changes in symptoms.  Call us  before going to the ED for breathing or allergy symptoms since we might be able to fit you in for a sick visit. Feel free to contact us  anytime with any questions, problems, or concerns.  It was a pleasure to meet you today!  Websites that have reliable patient information: 1. American Academy of Asthma, Allergy, and Immunology: www.aaaai.org 2. Food Allergy Research and Education (FARE): foodallergy.org 3. Mothers of Asthmatics: http://www.asthmacommunitynetwork.org 4. American College of Allergy, Asthma, and Immunology: www.acaai.org      "Like" us  on Facebook and Instagram for our latest updates!      A healthy democracy works best when [employer] participate! Make sure you are registered to vote! If you have moved or changed any of your contact information, you will need to get this updated before voting! Scan the QR codes below to learn more!       Check out this information handout from the [employer] of Asthma, Allergy, and Immunology on rhinitis!   https://rebrand.ly/AAC-Rhinitis

## 2024-01-24 NOTE — Progress Notes (Signed)
 NEW PATIENT  Date of Service/Encounter:  01/24/24  Consult requested by: Isabella Browning, FNP   Assessment:   Moderate persistent asthma, uncomplicated  Chronic rhinitis  Plan/Recommendations:   1. Moderate persistent asthma, uncomplicated - Lung testing looked fairly good, but did improve with albuterol  which is consistent with asthma. - This does not necessarily rule it in, but it points towards a diagnosis of asthma. - Reassuringly, the use of the AirSupra  seemed to be helpful.  - We are going to start you on a daily controller medication that can keep inflammation under control and help keep your lungs open throughout the day. - Sample of Breztri provided. - We will send it in if you think that this is helpful. - Spacer sample and demonstration provided. - Daily controller medication(s): Breztri 160/9/4.76mcg two puffs twice daily - Prior to physical activity: AirSupra  2 puffs 10-15 minutes before physical activity. - Rescue medications: AirSupra  2 puffs every 4-6 hours as needed - Asthma control goals:  * Full participation in all desired activities (may need albuterol  before activity) * Albuterol  use two time or less a week on average (not counting use with activity) * Cough interfering with sleep two time or less a month * Oral steroids no more than once a year * No hospitalizations  2. Chronic rhinitis - Because of insurance stipulations, we cannot do skin testing on the same day as your first visit. - We are all working to fight this, but for now we need to do two separate visits.  - We will know more after we do testing at the next visit.  - The skin testing visit can be squeezed in at your convenience.  - Then we can make a more full plan to address all of your symptoms. - Be sure to stop your antihistamines for 3 days before this appointment.   3. Return in about 1 week (around 01/31/2024) for SKIN TESTING (1-55). You can have the follow up appointment with Dr.  Iva or a Nurse Practicioner (our Nurse Practitioners are excellent and always have Physician oversight!).    This note in its entirety was forwarded to the Provider who requested this consultation.  Subjective:   Isabella Juarez is a 58 y.o. female presenting today for evaluation of  Chief Complaint  Patient presents with   Cough    Scratchy voice, has been antibotics several times and the cough hasn't leave.    Isabella Juarez has a history of the following: Patient Active Problem List   Diagnosis Date Noted   Upper airway cough syndrome vs Cough variant asthma 10/09/2023   Overactive bladder 04/19/2020   Morbid obesity (HCC) 07/15/2014   Gastroesophageal reflux disease without esophagitis 02/14/2014   Essential hypertension, benign 11/09/2013   Hyperlipidemia 08/13/2013   Type 2 diabetes mellitus (HCC) 08/13/2013   Anxiety and depression 08/13/2013   NAFLD (nonalcoholic fatty liver disease) 87/96/7987   Obesity 04/23/2011    History obtained from: chart review and patient.  Discussed the use of AI scribe software for clinical note transcription with the patient and/or guardian, who gave verbal consent to proceed.  Isabella Juarez was referred by Isabella Browning, FNP.     Isabella Juarez is a 58 y.o. female presenting for an evaluation of asthma and allergies .   Asthma/Respiratory Symptom History: She was evaluated by pulmonology.  Last appointment was in June or 2025.  At that time, she showed improvement with prednisone  and promethazine .  He had an absolute eosinophil  count in May 2025 of 600 with an IgE level of 94.  She was continued on AirSupra  as well as Augmentin . She actually was weaned off of the gabapentin  and her coughing worsened. She thinks that the gabapentin  helps, but she is not excited about taking it every day. She is much better on antibiotics and steroids. She has a lot of coughing with aching in her throat and her costochondral region.   She has been experiencing  a persistent cough since November of last year, which is severe enough to cause chest aches and has been debilitating at times, requiring her to stay in bed. She has tried various treatments including antibiotics, steroids, cough medicine, an inhaler, and allergy pills, with some improvement noted while on antibiotics and steroids. She also uses Flonase in the mornings and has found some relief with promethazine , although she cannot take it during the day. Gabapentin  has been helpful, but she attempted to wean off it for an upcoming appointment. Air Supra was also beneficial when used once daily.  A chest x-ray was performed and was clear, but she has not had a chest CT. She has not been tried on Symbicort or Breztri. She has a history of low potassium, for which she takes large potassium pills twice a day, although she sometimes struggles to take them at night. She had hand surgery in December, and her potassium was noted to be low at that time. She has no known history of asthma, and her mother confirmed she did not have it as a child. She has been on Flonase and another nasal spray in the past, which she found unpleasant.  Her past medical history includes diverticulitis, for which she has had intermittent flares requiring antibiotics. She has undergone both endoscopy and colonoscopy, which revealed gastritis and polyps. She is on pantoprazole  and famotidine for reflux, which was identified during these procedures. 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 She experienced significant pulmonary symptoms during her last COVID-19 infection in 2022, but these resolved until the onset of her current symptoms in November. She has a history of low potassium. She is on a supplement for that.   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 She has two adult children, a daughter who teaches sixth grade math and a  son who works for [employer].   Otherwise, there is no history of other atopic diseases, including drug allergies, stinging insect allergies, or contact dermatitis. There is no significant infectious history. Vaccinations are up to date.    Past Medical History: Patient Active Problem List   Diagnosis Date Noted   Upper airway cough syndrome vs Cough variant asthma 10/09/2023   Overactive bladder 04/19/2020   Morbid obesity (HCC) 07/15/2014   Gastroesophageal reflux disease without esophagitis 02/14/2014   Essential hypertension, benign 11/09/2013   Hyperlipidemia 08/13/2013   Type 2 diabetes mellitus (HCC) 08/13/2013   Anxiety and depression 08/13/2013   NAFLD (nonalcoholic fatty liver disease) 87/96/7987   Obesity 04/23/2011    Medication List:  Allergies as of 01/24/2024       Reactions   Dyazide [hydrochlorothiazide -triamterene]    Fatigue    Tanzeum  [albiglutide ] Other (See Comments)   Pharyngitis; burning in the mouth   Vasotec [enalapril] Cough   Penicillins Rash   Childhood allergy        Medication List        Accurate as of  January 24, 2024 12:52 PM. If you have any questions, ask your nurse or doctor.          Airsupra  90-80 MCG/ACT Aero Generic drug: Albuterol -Budesonide Inhale 2 puffs into the lungs every 4 (four) hours as needed.   amoxicillin -clavulanate 875-125 MG tablet Commonly known as: AUGMENTIN  Take 1 tablet by mouth 2 (two) times daily.   atorvastatin  20 MG tablet Commonly known as: LIPITOR Take 20 mg by mouth daily.   Biotrue Soln Place 1 drop into both eyes at bedtime.   calcium -vitamin D  500-5 MG-MCG tablet Commonly known as: OSCAL WITH D Take 1 tablet by mouth.   citalopram  40 MG tablet Commonly known as: CELEXA  Take 1 tablet (40 mg total) by mouth daily.   dexamethasone  0.5 MG/5ML solution Commonly known as: DECADRON  0.5 mg.   dicyclomine  10 MG capsule Commonly known as: BENTYL  Take 2 capsules (20  mg total) by mouth 4 (four) times daily. Take 30-60 minutes prior to meals   docusate sodium  100 MG capsule Commonly known as: Stool Softener Take 1 capsule (100 mg total) by mouth daily.   famotidine 40 MG tablet Commonly known as: PEPCID Take 40 mg by mouth daily.   gabapentin  100 MG capsule Commonly known as: NEURONTIN  1 tab at bedtime add 1 pill every 3-5 days until up to one 4 times a day   hydrochlorothiazide  25 MG tablet Commonly known as: HYDRODIURIL  Take 1 tablet (25 mg total) by mouth daily.   Jardiance 25 MG Tabs tablet Generic drug: empagliflozin Take 25 mg by mouth daily.   levocetirizine 5 MG tablet Commonly known as: XYZAL Take 5 mg by mouth daily.   linaclotide  145 MCG Caps capsule Commonly known as: Linzess  Take 1 capsule (145 mcg total) by mouth daily before breakfast. No refills can be provided without an appt What changed:  when to take this additional instructions   losartan  50 MG tablet Commonly known as: COZAAR  Take 1 tablet by mouth once daily   meclizine  12.5 MG tablet Commonly known as: ANTIVERT  Take 1 tablet (12.5 mg total) by mouth 3 (three) times daily as needed for dizziness.   Melatonin 1 MG Caps Take 1 mg by mouth at bedtime.   metFORMIN  500 MG 24 hr tablet Commonly known as: GLUCOPHAGE -XR Take 2 tablets by mouth twice daily   methocarbamol 500 MG tablet Commonly known as: ROBAXIN Take 1 tablet by mouth at bedtime.   mirabegron  ER 50 MG Tb24 tablet Commonly known as: MYRBETRIQ  Take 1 tablet (50 mg total) by mouth daily.   Mounjaro 15 MG/0.5ML Pen Generic drug: tirzepatide Inject 15 mg into the skin daily.   NP Thyroid  120 MG tablet Generic drug: thyroid  TAKE 1 TABLET BY MOUTH BEFORE BREAKFAST What changed: See the new instructions.   NP Thyroid  60 MG tablet Generic drug: thyroid  Take 1 tablet (60 mg total) by mouth daily before breakfast. What changed: Another medication with the same name was changed. Make sure you  understand how and when to take each.   olopatadine 0.1 % ophthalmic solution Commonly known as: PATANOL Place 1 drop into both eyes 2 (two) times daily as needed for allergies.   ondansetron  4 MG tablet Commonly known as: Zofran  Take 1 tablet (4 mg total) by mouth every 8 (eight) hours as needed for nausea or vomiting.   pantoprazole  40 MG tablet Commonly known as: PROTONIX  Take 1 tablet (40 mg total) by mouth 2 (two) times daily.   potassium chloride SA 20 MEQ tablet  Commonly known as: KLOR-CON M Take 2 tablets by mouth once daily   predniSONE  10 MG tablet Commonly known as: DELTASONE  Take  4 each am x 2 days,   2 each am x 2 days,  1 each am x 2 days and stop   promethazine -dextromethorphan 6.25-15 MG/5ML syrup Commonly known as: PROMETHAZINE -DM TAKE 5 ML BY MOUTH  4 TIMES DAILY AS NEEDED FOR COUGH   tamsulosin  0.4 MG Caps capsule Commonly known as: Flomax  Take 1 capsule (0.4 mg total) by mouth daily after supper.        Birth History: non-contributory  Developmental History: non-contributory  Past Surgical History: Past Surgical History:  Procedure Laterality Date   CESAREAN SECTION  08/02/1987, 12/15/1990   CHOLECYSTECTOMY  01/05/2005   lap. chole.   CHOLECYSTECTOMY     COLONOSCOPY     COLONOSCOPY W/ POLYPECTOMY  08/18/2012   EXTRACORPOREAL SHOCK WAVE LITHOTRIPSY Left 07/26/2020   Procedure: EXTRACORPOREAL SHOCK WAVE LITHOTRIPSY (ESWL);  Surgeon: Sherrilee Belvie CROME, MD;  Location: [facility];  Service: Urology;  Laterality: Left;   LIVER BIOPSY  01/05/2005   MASS EXCISION Left 09/04/2012   Procedure: Excision Left Axillary Mass 10 cm;  Surgeon: Sherlean JINNY Laughter, MD;  Location: [facility];  Service: General;  Laterality: Left;   TOTAL ABDOMINAL HYSTERECTOMY W/ BILATERAL SALPINGOOPHORECTOMY  03/16/2009     Family History: Family History  Problem Relation Age of Onset   Hypertension Mother    Hyperlipidemia Mother    Other Mother        breast  masses   Kidney disease Father    Hearing loss Father    Breast cancer Maternal Grandmother    Stroke Maternal Grandmother    Hearing loss Paternal Grandmother    COPD Paternal Grandfather    Hearing loss Paternal Uncle    Heart disease Paternal Uncle    Colon cancer Neg Hx    Esophageal cancer Neg Hx    Stomach cancer Neg Hx    Rectal cancer Neg Hx      Social History: Lille lives at home with her family. She lives in a house that is 59 years old. There is wood throughout the home. There is gas and electrica heating and central cooling. There is a dog inside of the home. There is no tobacco exposure in the home. 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 There is exposure to fumes, chemicals, and dust. There is a HEPA filter in the home. She does not live near an interstate or industrial area.    Review of systems otherwise negative other than that mentioned in the HPI.    Objective:   Blood pressure 120/72, pulse 91, temperature 98 F (36.7 C), temperature source Temporal, resp. rate 18, height 5' 2.99 (1.6 m), weight 182 lb 6.4 oz (82.7 kg), SpO2 95%. Body mass index is 32.32 kg/m.     Physical Exam Vitals reviewed.  Constitutional:      Appearance: She is well-developed.  HENT:     Head: Normocephalic and atraumatic.     Right Ear: Tympanic membrane, ear canal and external ear normal. No drainage, swelling or tenderness. Tympanic membrane is not injected, scarred, erythematous, retracted or bulging.     Left Ear: Tympanic membrane, ear canal and external ear normal. No drainage, swelling or tenderness. Tympanic membrane is not injected, scarred, erythematous, retracted or bulging.     Nose: No nasal deformity, septal deviation, mucosal  edema or rhinorrhea.     Right Turbinates: Enlarged, swollen and pale.     Left Turbinates: Enlarged, swollen and pale.     Right Sinus: No maxillary sinus tenderness or frontal sinus  tenderness.     Left Sinus: No maxillary sinus tenderness or frontal sinus tenderness.     Comments: No polyps noted.     Mouth/Throat:     Lips: Pink.     Mouth: Mucous membranes are moist. Mucous membranes are not pale and not dry.     Pharynx: Uvula midline.     Comments: Cobblestoning in the posterior oropharynx.  Eyes:     General:        Right eye: No discharge.        Left eye: No discharge.     Conjunctiva/sclera: Conjunctivae normal.     Right eye: Right conjunctiva is not injected. No chemosis.    Left eye: Left conjunctiva is not injected. No chemosis.    Pupils: Pupils are equal, round, and reactive to light.  Cardiovascular:     Rate and Rhythm: Normal rate and regular rhythm.     Heart sounds: Normal heart sounds.  Pulmonary:     Effort: Pulmonary effort is normal. No tachypnea, accessory muscle usage or respiratory distress.     Breath sounds: Normal breath sounds. No wheezing, rhonchi or rales.  Chest:     Chest wall: No tenderness.  Abdominal:     Tenderness: There is no abdominal tenderness. There is no guarding or rebound.  Lymphadenopathy:     Head:     Right side of head: No submandibular, tonsillar or occipital adenopathy.     Left side of head: No submandibular, tonsillar or occipital adenopathy.     Cervical: No cervical adenopathy.  Skin:    General: Skin is warm.     Capillary Refill: Capillary refill takes less than 2 seconds.     Coloration: Skin is not pale.     Findings: No abrasion, erythema, petechiae or rash. Rash is not papular, urticarial or vesicular.  Neurological:     Mental Status: She is alert.  Psychiatric:        Behavior: Behavior is cooperative.      Diagnostic studies:    Spirometry: results normal (FEV1: 2.09/86%, FVC: 2.48/81%, FEV1/FVC: 84%).    Spirometry consistent with normal pattern. Xopenex four puffs via MDI treatment given in clinic with improvement in FEV1, but not significant per ATS criteria.  Allergy Studies:  deferred due to insurance stipulations that require a separate visit for testing          Marty Shaggy, MD [facility] 

## 2024-01-27 NOTE — Addendum Note (Signed)
 Addended by: JENEL MARYLYNN GRADE on: 01/27/2024 04:19 PM   Modules accepted: Orders

## 2024-02-12 ENCOUNTER — Encounter: Payer: Self-pay | Admitting: Allergy & Immunology

## 2024-02-12 ENCOUNTER — Ambulatory Visit: Admitting: Allergy & Immunology

## 2024-02-12 DIAGNOSIS — J302 Other seasonal allergic rhinitis: Secondary | ICD-10-CM | POA: Diagnosis not present

## 2024-02-12 DIAGNOSIS — J3089 Other allergic rhinitis: Secondary | ICD-10-CM

## 2024-02-12 MED ORDER — CARBINOXAMINE MALEATE 4 MG PO TABS: ORAL_TABLET | ORAL | 0 refills | Status: DC

## 2024-02-12 MED ORDER — BREZTRI AEROSPHERE 160-9-4.8 MCG/ACT IN AERO: 2.0000 | INHALATION_SPRAY | Freq: Two times a day (BID) | RESPIRATORY_TRACT | 5 refills | Status: AC

## 2024-02-12 MED ORDER — MONTELUKAST SODIUM 10 MG PO TABS
10.0000 mg | ORAL_TABLET | Freq: Every day | ORAL | 1 refills | Status: AC
Start: 2024-02-12 — End: 2024-05-12

## 2024-02-12 MED ORDER — PREDNISONE 10 MG PO TABS: ORAL_TABLET | ORAL | 0 refills | Status: AC

## 2024-02-12 NOTE — Patient Instructions (Addendum)
 1. Moderate persistent asthma, uncomplicated - Lung testing looked not done today.  - Please start the Breztri .  - I sent in the medication with the copay card.  - Prednisone  pack provided.  - Spacer sample and demonstration provided. - Daily controller medication(s): Breztri  160/9/4.69mcg two puffs twice daily - Prior to physical activity: AirSupra  2 puffs 10-15 minutes before physical activity. - Rescue medications: AirSupra  2 puffs every 4-6 hours as needed - Asthma control goals:  * Full participation in all desired activities (may need albuterol  before activity) * Albuterol  use two time or less a week on average (not counting use with activity) * Cough interfering with sleep two time or less a month * Oral steroids no more than once a year * No hospitalizations  2. Chronic rhinitis - Testing today showed: grasses, ragweed, weeds, indoor molds, dust mites, cat, and dog - Copy of test results provided.  - Avoidance measures provided. - Stop taking: chlorpheniramine - Continue with: Flonase (fluticasone) one spray per nostril daily (AIM FOR EAR ON EACH SIDE) - Start taking: Carbinoxamine  4mg  tablet 1-2 times daily (this can cause some sleepiness, so beware of this rare side effect) and Singulair  (montelukast ) 10mg   - Singulair  can cause irritability and bad dreams, so beware of this rare side effect.  - You can use an extra dose of the antihistamine, if needed, for breakthrough symptoms.  - Consider nasal saline rinses 1-2 times daily to remove allergens from the nasal cavities as well as help with mucous clearance (this is especially helpful to do before the nasal sprays are given) - Consider allergy  shots as a means of long-term control. - Allergy  shots re-train and reset the immune system to ignore environmental allergens and decrease the resulting immune response to those allergens (sneezing, itchy watery eyes, runny nose, nasal congestion, etc).    - Allergy  shots improve  symptoms in 75-85% of patients.  - This can treat allergies AND asthma.   3. Return in about 2 months (around 04/13/2024). You can have the follow up appointment with Dr. Iva or a Nurse Practicioner (our Nurse Practitioners are excellent and always have Physician oversight!).    Please inform us  of any Emergency Department visits, hospitalizations, or changes in symptoms. Call us  before going to the ED for breathing or allergy  symptoms since we might be able to fit you in for a sick visit. Feel free to contact us  anytime with any questions, problems, or concerns.  It was a pleasure to meet you today!  Websites that have reliable patient information: 1. American Academy of Asthma, Allergy , and Immunology: www.aaaai.org 2. Food Allergy  Research and Education (FARE): foodallergy.org 3. Mothers of Asthmatics: http://www.asthmacommunitynetwork.org 4. [employer] of Allergy , Asthma, and Immunology: www.acaai.org      "Like" us  on Facebook and Instagram for our latest updates!      A healthy democracy works best when [employer] participate! Make sure you are registered to vote! If you have moved or changed any of your contact information, you will need to get this updated before voting! Scan the QR codes below to learn more!       Check out this information handout from the [employer] of Asthma, Allergy , and Immunology on rhinitis!   [email or web address]       Airborne Adult Perc - 02/12/24 0947     Time Antigen Placed 9052    Allergen Manufacturer Jestine    Location Back    Number of Test 55    1. Control-Buffer  50% Glycerol Negative    2. Control-Histamine 2+    3. Bahia Negative    4. [place] Negative    5. Johnson Negative    6. [place]  Blue Negative    7. Meadow Fescue Negative    8. Perennial Rye Negative    9. Timothy Negative    10. Ragweed Mix 3+    11. Cocklebur Negative    12. Plantain,  English Negative    13. Baccharis Negative     14. Dog Fennel Negative    15. Russian Thistle Negative    16. Lamb's Quarters Negative    17. Sheep Sorrell Negative    18. Rough Pigweed Negative    19. Marsh Elder, Rough Negative    20. Mugwort, Common Negative    21. Box, Elder Negative    22. Cedar, red Negative    23. Sweet Gum Negative    24. Pecan Pollen Negative    25. Pine Mix Negative    26. Walnut, Black Pollen Negative    27. Red Mulberry Negative    28. Ash Mix Negative    29. Birch Mix Negative    30. Beech American Negative    31. Cottonwood, Guinea-[place]issau Negative    32. Hickory, White Negative    33. Maple Mix Negative    34. Oak, [place] Mix Negative    35. Sycamore Eastern Negative    36. Alternaria Alternata Negative    37. Cladosporium Herbarum Negative    38. Aspergillus Mix Negative    39. Penicillium Mix Negative    40. Bipolaris Sorokiniana (Helminthosporium) Negative    41. Drechslera Spicifera (Curvularia) Negative    42. Mucor Plumbeus Negative    43. Fusarium Moniliforme Negative    44. Aureobasidium Pullulans (pullulara) Negative    45. Rhizopus Oryzae Negative    46. Botrytis Cinera Negative    47. Epicoccum Nigrum Negative    48. Phoma Betae Negative    49. Dust Mite Mix 2+    50. Cat Hair 10,000 BAU/ml 3+    51.  Dog Epithelia 3+    52. Mixed Feathers Negative    53. Horse Epithelia Negative    54. Cockroach, German Negative    55. Tobacco Leaf Negative          Intradermal - 02/12/24 1017     Time Antigen Placed 1030    Allergen Manufacturer Greer    Location Arm    Number of Test 12    Control Negative    Bahia 1+    [place] Negative    Johnson Negative    7 Grass Negative    Weed Mix 2+    Tree Mix Negative    Mold 1 Negative    Mold 2 2+    Mold 3 Negative    Mold 4 2+    Cockroach Negative          Reducing Pollen Exposure  The American Academy of Allergy , Asthma and Immunology suggests the following steps to reduce your exposure to pollen during allergy   seasons.    Do not hang sheets or clothing out to dry; pollen may collect on these items. Do not mow lawns or spend time around freshly cut grass; mowing stirs up pollen. Keep windows closed at night.  Keep car windows closed while driving. Minimize morning activities outdoors, a time when pollen counts are usually at their highest. Stay indoors as much as possible when pollen counts or humidity is high and  on windy days when pollen tends to remain in the air longer. Use air conditioning when possible.  Many air conditioners have filters that trap the pollen spores. Use a HEPA room air filter to remove pollen form the indoor air you breathe.  Control of Mold Allergen   Mold and fungi can grow on a variety of surfaces provided certain temperature and moisture conditions exist.  Outdoor molds grow on plants, decaying vegetation and soil.  The major outdoor mold, Alternaria and Cladosporium, are found in very high numbers during hot and dry conditions.  Generally, a late Summer - Fall peak is seen for common outdoor fungal spores.  Rain will temporarily lower outdoor mold spore count, but counts rise rapidly when the rainy period ends.  The most important indoor molds are Aspergillus and Penicillium.  Dark, humid and poorly ventilated basements are ideal sites for mold growth.  The next most common sites of mold growth are the bathroom and the kitchen.    Indoor (Perennial) Mold Control   Positive indoor molds via skin testing: Aspergillus, Penicillium, Fusarium, Aureobasidium (Pullulara), and Rhizopus  Maintain humidity below 50%. Clean washable surfaces with 5% bleach solution. Remove sources e.g. contaminated carpets.    Control of Dust Mite Allergen    Dust mites play a major role in allergic asthma and rhinitis.  They occur in environments with high humidity wherever human skin is found.  Dust mites absorb humidity from the atmosphere (ie, they do not drink) and feed on organic matter  (including shed human and animal skin).  Dust mites are a microscopic type of insect that you cannot see with the naked eye.  High levels of dust mites have been detected from mattresses, pillows, carpets, upholstered furniture, bed covers, clothes, soft toys and any woven material.  The principal allergen of the dust mite is found in its feces.  A gram of dust may contain 1,000 mites and 250,000 fecal particles.  Mite antigen is easily measured in the air during house cleaning activities.  Dust mites do not bite and do not cause harm to humans, other than by triggering allergies/asthma.    Ways to decrease your exposure to dust mites in your home:  Encase mattresses, box springs and pillows with a mite-impermeable barrier or cover   Wash sheets, blankets and drapes weekly in hot water (130 F) with detergent and dry them in a dryer on the hot setting.  Have the room cleaned frequently with a vacuum cleaner and a damp dust-mop.  For carpeting or rugs, vacuuming with a vacuum cleaner equipped with a high-efficiency particulate air (HEPA) filter.  The dust mite allergic individual should not be in a room which is being cleaned and should wait 1 hour after cleaning before going into the room. Do not sleep on upholstered furniture (eg, couches).   If possible removing carpeting, upholstered furniture and drapery from the home is ideal.  Horizontal blinds should be eliminated in the rooms where the person spends the most time (bedroom, study, television room).  Washable vinyl, roller-type shades are optimal. Remove all non-washable stuffed toys from the bedroom.  Wash stuffed toys weekly like sheets and blankets above.   Reduce indoor humidity to less than 50%.  Inexpensive humidity monitors can be purchased at most hardware stores.  Do not use a humidifier as can make the problem worse and are not recommended.  Control of Dog or Cat Allergen  Avoidance is the best way to manage a dog or cat allergy .  If you  have a dog or cat and are allergic to dog or cats, consider removing the dog or cat from the home. If you have a dog or cat but don't want to find it a new home, or if your family wants a pet even though someone in the household is allergic, here are some strategies that may help keep symptoms at bay:  Keep the pet out of your bedroom and restrict it to only a few rooms. Be advised that keeping the dog or cat in only one room will not limit the allergens to that room. Don't pet, hug or kiss the dog or cat; if you do, wash your hands with soap and water. High-efficiency particulate air (HEPA) cleaners run continuously in a bedroom or living room can reduce allergen levels over time. Regular use of a high-efficiency vacuum cleaner or a central vacuum can reduce allergen levels. Giving your dog or cat a bath at least once a week can reduce airborne allergen.  Allergy  Shots  Allergies are the result of a chain reaction that starts in the immune system. Your immune system controls how your body defends itself. For instance, if you have an allergy  to pollen, your immune system identifies pollen as an invader or allergen. Your immune system overreacts by producing antibodies called Immunoglobulin E (IgE). These antibodies travel to cells that release chemicals, causing an allergic reaction.  The concept behind allergy  immunotherapy, whether it is received in the form of shots or tablets, is that the immune system can be desensitized to specific allergens that trigger allergy  symptoms. Although it requires time and patience, the payback can be long-term relief. Allergy  injections contain a dilute solution of those substances that you are allergic to based upon your skin testing and allergy  history.   How Do Allergy  Shots Work?  Allergy  shots work much like a vaccine. Your body responds to injected amounts of a particular allergen given in increasing doses, eventually developing a resistance and tolerance to  it. Allergy  shots can lead to decreased, minimal or no allergy  symptoms.  There generally are two phases: build-up and maintenance. Build-up often ranges from three to six months and involves receiving injections with increasing amounts of the allergens. The shots are typically given once or twice a week, though more rapid build-up schedules are sometimes used.  The maintenance phase begins when the most effective dose is reached. This dose is different for each person, depending on how allergic you are and your response to the build-up injections. Once the maintenance dose is reached, there are longer periods between injections, typically two to four weeks.  Occasionally doctors give cortisone-type shots that can temporarily reduce allergy  symptoms. These types of shots are different and should not be confused with allergy  immunotherapy shots.  Who Can Be Treated with Allergy  Shots?  Allergy  shots may be a good treatment approach for people with allergic rhinitis (hay fever), allergic asthma, conjunctivitis (eye allergy ) or stinging insect allergy .   Before deciding to begin allergy  shots, you should consider:   The length of allergy  season and the severity of your symptoms  Whether medications and/or changes to your environment can control your symptoms  Your desire to avoid long-term medication use  Time: allergy  immunotherapy requires a major time commitment  Cost: may vary depending on your insurance coverage  Allergy  shots for children age 88 and older are effective and often well tolerated. They might prevent the onset of new allergen sensitivities or the progression to asthma.  Allergy  shots are not started on patients who are pregnant but can be continued on patients who become pregnant while receiving them. In some patients with other medical conditions or who take certain common medications, allergy  shots may be of risk. It is important to mention other medications you talk to your  allergist.   What are the two types of build-ups offered:   RUSH or Rapid Desensitization -- one day of injections lasting from 8:30-4:30pm, injections every 1 hour.  Approximately half of the build-up process is completed in that one day.  The following week, normal build-up is resumed, and this entails ~16 visits either weekly or twice weekly, until reaching your "maintenance dose" which is continued weekly until eventually getting spaced out to every month for a duration of 3 to 5 years. The regular build-up appointments are nurse visits where the injections are administered, followed by required monitoring for 30 minutes.    Traditional build-up -- weekly visits for 6 -12 months until reaching "maintenance dose", then continue weekly until eventually spacing out to every 4 weeks as above. At these appointments, the injections are administered, followed by required monitoring for 30 minutes.     Either way is acceptable, and both are equally effective. With the rush protocol, the advantage is that less time is spent here for injections overall AND you would also reach maintenance dosing faster (which is when the clinical benefit starts to become more apparent). Not everyone is a candidate for rapid desensitization.   IF we proceed with the RUSH protocol, there are premedications which must be taken the day before and the day after the rush only (this includes antihistamines, steroids, and Singulair ).  After the rush day, no prednisone  or Singulair  is required, and we just recommend antihistamines taken on your injection day.  What Is An Estimate of the Costs?  If you are interested in starting allergy  injections, please check with your insurance company about your coverage for both allergy  vial sets and allergy  injections.  Please do so prior to making the appointment to start injections.  The following are CPT codes to give to your insurance company. These are the amounts we BILL to the insurance  company, but the amount YOU WILL PAY and WE RECEIVE IS SUBSTANTIALLY LESS and depends on the contracts we have with different insurance companies.   Amount Billed to Insurance One allergy  vial set  CPT 95165   $ 1200     Two allergy  vial set  CPT 95165   $ 2400     Three allergy  vial set  CPT 95165   $ 3600     One injection   CPT 95115   $ 35  Two injections   CPT 95117   $ 40 RUSH (Rapid Desensitization) CPT 95180 x 8 hours $500/hour  Regarding the allergy  injections, your co-pay may or may not apply with each injection, so please confirm this with your insurance company. When you start allergy  injections, 1 or 2 sets of vials are made based on your allergies.  Not all patients can be on one set of vials. A set of vials lasts 6 months to a year depending on how quickly you can proceed with your build-up of your allergy  injections. Vials are personalized for each patient depending on their specific allergens.  How often are allergy  injection given during the build-up period?   Injections are given at least weekly during the build-up period until your maintenance dose is achieved. Per the doctor's  discretion, you may have the option of getting allergy  injections two times per week during the build-up period. However, there must be at least 48 hours between injections. The build-up period is usually completed within 6-12 months depending on your ability to schedule injections and for adjustments for reactions. When maintenance dose is reached, your injection schedule is gradually changed to every two weeks and later to every three weeks. Injections will then continue every 4 weeks. Usually, injections are continued for a total of 3-5 years.   When Will I Feel Better?  Some may experience decreased allergy  symptoms during the build-up phase. For others, it may take as long as 12 months on the maintenance dose. If there is no improvement after a year of maintenance, your allergist will discuss other  treatment options with you.  If you aren't responding to allergy  shots, it may be because there is not enough dose of the allergen in your vaccine or there are missing allergens that were not identified during your allergy  testing. Other reasons could be that there are high levels of the allergen in your environment or major exposure to non-allergic triggers like tobacco smoke.  What Is the Length of Treatment?  Once the maintenance dose is reached, allergy  shots are generally continued for three to five years. The decision to stop should be discussed with your allergist at that time. Some people may experience a permanent reduction of allergy  symptoms. Others may relapse and a longer course of allergy  shots can be considered.  What Are the Possible Reactions?  The two types of adverse reactions that can occur with allergy  shots are local and systemic. Common local reactions include very mild redness and swelling at the injection site, which can happen immediately or several hours after. Report a delayed reaction from your last injection. These include arm swelling or runny nose, watery eyes or cough that occurs within 12-24 hours after injection. A systemic reaction, which is less common, affects the entire body or a particular body system. They are usually mild and typically respond quickly to medications. Signs include increased allergy  symptoms such as sneezing, a stuffy nose or hives.   Rarely, a serious systemic reaction called anaphylaxis can develop. Symptoms include swelling in the throat, wheezing, a feeling of tightness in the chest, nausea or dizziness. Most serious systemic reactions develop within 30 minutes of allergy  shots. This is why it is strongly recommended you wait in your doctor's office for 30 minutes after your injections. Your allergist is trained to watch for reactions, and his or her staff is trained and equipped with the proper medications to identify and treat them.    Report to the nurse immediately if you experience any of the following symptoms: swelling, itching or redness of the skin, hives, watery eyes/nose, breathing difficulty, excessive sneezing, coughing, stomach pain, diarrhea, or light headedness. These symptoms may occur within 15-20 minutes after injection and may require medication.   Who Should Administer Allergy  Shots?  The preferred location for receiving shots is your prescribing allergist's office. Injections can sometimes be given at another facility where the physician and staff are trained to recognize and treat reactions, and have received instructions by your prescribing allergist.  What if I am late for an injection?   Injection dose will be adjusted depending upon how many days or weeks you are late for your injection.   What if I am sick?   Please report any illness to the nurse before receiving injections. She may adjust  your dose or postpone injections depending on your symptoms. If you have fever, flu, sinus infection or chest congestion it is best to postpone allergy  injections until you are better. Never get an allergy  injection if your asthma is causing you problems. If your symptoms persist, seek out medical care to get your health problem under control.  What If I am or Become Pregnant:  Women that become pregnant should schedule an appointment with [facility] before receiving any further allergy  injections.

## 2024-02-12 NOTE — Progress Notes (Signed)
 FOLLOW UP  Date of Service/Encounter:  02/12/24   Assessment:   Moderate persistent asthma, uncomplicated   Perennial and seasonal allergic rhinitis (grasses, ragweed, weeds, indoor molds, dust mites, cat, and dog)  Plan/Recommendations:   1. Moderate persistent asthma, uncomplicated - Lung testing looked not done today.  - Please start the Breztri .  - I sent in the medication with the copay card.  - Prednisone  pack provided.  - Spacer sample and demonstration provided. - Daily controller medication(s): Breztri  160/9/4.37mcg two puffs twice daily - Prior to physical activity: AirSupra  2 puffs 10-15 minutes before physical activity. - Rescue medications: AirSupra  2 puffs every 4-6 hours as needed - Asthma control goals:  * Full participation in all desired activities (may need albuterol  before activity) * Albuterol  use two time or less a week on average (not counting use with activity) * Cough interfering with sleep two time or less a month * Oral steroids no more than once a year * No hospitalizations  2. Chronic rhinitis - Testing today showed: grasses, ragweed, weeds, indoor molds, dust mites, cat, and dog - Copy of test results provided.  - Avoidance measures provided. - Stop taking: chlorpheniramine - Continue with: Flonase (fluticasone) one spray per nostril daily (AIM FOR EAR ON EACH SIDE) - Start taking: Carbinoxamine  4mg  tablet 1-2 times daily (this can cause some sleepiness, so beware of this rare side effect) and Singulair  (montelukast ) 10mg   - Singulair  can cause irritability and bad dreams, so beware of this rare side effect.  - You can use an extra dose of the antihistamine, if needed, for breakthrough symptoms.  - Consider nasal saline rinses 1-2 times daily to remove allergens from the nasal cavities as well as help with mucous clearance (this is especially helpful to do before the nasal sprays are given) - Consider allergy  shots as a means of long-term  control. - Allergy  shots re-train and reset the immune system to ignore environmental allergens and decrease the resulting immune response to those allergens (sneezing, itchy watery eyes, runny nose, nasal congestion, etc).    - Allergy  shots improve symptoms in 75-85% of patients.  - This can treat allergies AND asthma.   3. Return in about 2 months (around 04/13/2024). You can have the follow up appointment with Dr. Iva or a Nurse Practicioner (our Nurse Practitioners are excellent and always have Physician oversight!).    Subjective:   Isabella Juarez is a 58 y.o. female presenting today for follow up of  Chief Complaint  Patient presents with   Allergy  Testing    Adult Airborne 1-55    Isabella Juarez has a history of the following: Patient Active Problem List   Diagnosis Date Noted   Upper airway cough syndrome vs Cough variant asthma 10/09/2023   Overactive bladder 04/19/2020   Morbid obesity (HCC) 07/15/2014   Gastroesophageal reflux disease without esophagitis 02/14/2014   Essential hypertension, benign 11/09/2013   Hyperlipidemia 08/13/2013   Type 2 diabetes mellitus (HCC) 08/13/2013   Anxiety and depression 08/13/2013   NAFLD (nonalcoholic fatty liver disease) 87/96/7987   Obesity 04/23/2011    History obtained from: chart review and patient.  Discussed the use of AI scribe software for clinical note transcription with the patient and/or guardian, who gave verbal consent to proceed.  Isabella Juarez is a 58 y.o. female presenting for skin testing. She was last seen on September 5th. We could not do testing because her insurance company does not cover testing on the same day as  a New Patient visit. She has been off of all antihistamines 3 days in anticipation of the testing.     She has been experiencing persistent cough and allergy  symptoms since November of last year, which began after undergoing surgery in December. Her symptoms improve slightly with antibiotics but  return once the medication is stopped.  She has a history of exposure to allergens, including ragweed, grasses, weeds, and indoor mold mixes. She had cats in the past but not recently, and currently has a dog. Her home is [address] years old.  She had COVID-19 approximately three to four weeks ago, which may have exacerbated her symptoms. She feels tired and has been noted to wheeze recently.  Her current medications include Breztri , which she uses with a spacer, and an over-the-counter antihistamine, likely chlorpheniramine, which she finds not very effective. She has used prednisone  in the past with good effect, although it is not a regular medication for her.  She experiences postnasal drip and cough, with occasional mucus production.  Otherwise, there have been no changes to her past medical history, surgical history, family history, or social history.    Review of systems otherwise negative other than that mentioned in the HPI.    Objective:   There were no vitals taken for this visit. There is no height or weight on file to calculate BMI.    Physical exam deferred since this was a skin testing appointment only.   Diagnostic studies:    Allergy  Studies:     Airborne Adult Perc - 02/12/24 0947     Time Antigen Placed 0947    Allergen Manufacturer Jestine    Location Back    Number of Test 55    1. Control-Buffer 50% Glycerol Negative    2. Control-Histamine 2+    3. Bahia Negative    4. French Southern Territories Negative    5. Johnson Negative    6. [place]  Blue Negative    7. Meadow Fescue Negative    8. Perennial Rye Negative    9. Timothy Negative    10. Ragweed Mix 3+    11. Cocklebur Negative    12. Plantain,  English Negative    13. Baccharis Negative    14. Dog Fennel Negative    15. Russian Thistle Negative    16. Lamb's Quarters Negative    17. Sheep Sorrell Negative    18. Rough Pigweed Negative    19. Marsh Elder, Rough Negative    20. Mugwort, Common Negative    21.  Box, Elder Negative    22. Cedar, red Negative    23. Sweet Gum Negative    24. Pecan Pollen Negative    25. Pine Mix Negative    26. Walnut, Black Pollen Negative    27. Red Mulberry Negative    28. Ash Mix Negative    29. Birch Mix Negative    30. Beech American Negative    31. Cottonwood, Guinea-Bissau Negative    32. Hickory, White Negative    33. Maple Mix Negative    34. Oak, Guinea-[place]iss[place] Mix Negative    35. Sycamore Eastern Negative    36. Alternaria Alternata Negative    37. Cladosporium Herbarum Negative    38. Aspergillus Mix Negative    39. Penicillium Mix Negative    40. Bipolaris Sorokiniana (Helminthosporium) Negative    41. Drechslera Spicifera (Curvularia) Negative    42. Mucor Plumbeus Negative    43. Fusarium Moniliforme Negative    44. Aureobasidium Pullulans (  pullulara) Negative    45. Rhizopus Oryzae Negative    46. Botrytis Cinera Negative    47. Epicoccum Nigrum Negative    48. Phoma Betae Negative    49. Dust Mite Mix 2+    50. Cat Hair 10,000 BAU/ml 3+    51.  Dog Epithelia 3+    52. Mixed Feathers Negative    53. Horse Epithelia Negative    54. Cockroach, German Negative    55. Tobacco Leaf Negative          Intradermal - 02/12/24 1017     Time Antigen Placed 1030    Allergen Manufacturer Greer    Location Arm    Number of Test 12    Control Negative    Bahia 1+    French Southern Territories Negative    Johnson Negative    7 Grass Negative    Weed Mix 2+    Tree Mix Negative    Mold 1 Negative    Mold 2 2+    Mold 3 Negative    Mold 4 2+    Cockroach Negative          Allergy  testing results were read and interpreted by myself, documented by clinical staff.      Marty Shaggy, MD  [facility] 

## 2024-02-26 ENCOUNTER — Other Ambulatory Visit: Payer: Self-pay | Admitting: Internal Medicine

## 2024-02-26 ENCOUNTER — Other Ambulatory Visit: Payer: Self-pay | Admitting: Allergy & Immunology

## 2024-02-28 ENCOUNTER — Encounter: Payer: Self-pay | Admitting: Allergy & Immunology

## 2024-02-28 NOTE — Telephone Encounter (Signed)
 Pt is requesting gabapentin   Please advise

## 2024-03-02 MED ORDER — CARBINOXAMINE MALEATE 4 MG PO TABS: ORAL_TABLET | ORAL | 0 refills | Status: DC

## 2024-04-03 ENCOUNTER — Other Ambulatory Visit: Payer: Self-pay | Admitting: Internal Medicine

## 2024-04-03 ENCOUNTER — Other Ambulatory Visit: Payer: Self-pay | Admitting: Allergy & Immunology

## 2024-04-07 ENCOUNTER — Telehealth: Payer: Self-pay

## 2024-04-07 ENCOUNTER — Ambulatory Visit (INDEPENDENT_AMBULATORY_CARE_PROVIDER_SITE_OTHER): Payer: Self-pay | Admitting: Audiology

## 2024-04-07 DIAGNOSIS — H903 Sensorineural hearing loss, bilateral: Secondary | ICD-10-CM

## 2024-04-07 MED ORDER — GABAPENTIN 100 MG PO CAPS: ORAL_CAPSULE | ORAL | 5 refills | Status: DC

## 2024-04-07 NOTE — Telephone Encounter (Signed)
 I am unable to fill this medication

## 2024-04-07 NOTE — Telephone Encounter (Signed)
 done

## 2024-04-07 NOTE — Telephone Encounter (Signed)
 Pt / pharm is requesting 100mg  Gabapentin  with qt of 120  Please advise

## 2024-04-08 ENCOUNTER — Telehealth (INDEPENDENT_AMBULATORY_CARE_PROVIDER_SITE_OTHER): Payer: Self-pay | Admitting: Audiology

## 2024-04-08 NOTE — Progress Notes (Unsigned)
  [address], [address] [place], [place] [address] [phone]  Hearing Aid Check     Isabella Juarez comes for a scheduled appointment for a hearing aid check.  Accompanied ab:[id number]   Right Left  Hearing aid manufacturer Renaldo Oda BARGES SN: [id number] Renaldo Oda P50-R SN: [id number]  Hearing aid style Receiver in the canal Receiver in the canal  Hearing aid battery rechargeable rechargeable  Receiver      Dome/ custom earpiece Small open domes Small open domes  Retention wire yes yes  Warranty expiration date 01-11-2022 01-11-2022  Loss and Damage expired expired  Additional accessories Expiration date    Initial fitting date 12-16-2019 12-16-2019  Device was fit at: [facility]    Chief complaint: Patient reports that cannot use the phoen app because it doe snot conenct, also reports intermitettent streaming function which seems to stop if the phone is mocved to one ofher sides or goes in one of her pockets. Also rpeortsstruggles ot hear at work.  Actions taken: Cleaned both devices. Connected both aid sto the app. Called customer service   Services fee: $0 was paid at checkout.  Patient was oriented about applicable future service fees for hearing aid services.  Recommend: Return for a hearing aid check and hearing evaluation, next available. Return for a hearing evaluation and to see an ENT, if concerns with hearing changes arise. ***  Trenisha Lafavor MARIE LEROUX-MARTINEZ, AUD

## 2024-04-08 NOTE — Telephone Encounter (Signed)
 Patient was seen by Dr. Tiney for a Hearing Aid Check appt on 04/07/2024.  At check out, the patient mentioned wanting an updated Hearing Evaluation.  I was going to send the patient to [facility] and had her fill out their Client Information Form.  After looking in Dr. Rojean old system on 04/08/2024, we discovered that the patient has not seen Dr. Karis since June 2021 and therefore would be considered a new patient.  I called the patient and explained that a referral was needed so that we could schedule her for an appt with Dr. Karis and then be able to get an order for a Hearing Evaluation.  The patient expressed understanding and will contact her PCP to send us  a referral.

## 2024-04-09 ENCOUNTER — Ambulatory Visit (INDEPENDENT_AMBULATORY_CARE_PROVIDER_SITE_OTHER): Admitting: Allergy & Immunology

## 2024-04-09 ENCOUNTER — Other Ambulatory Visit: Payer: Self-pay

## 2024-04-09 VITALS — BP 102/62 | HR 94 | Temp 98.1°F

## 2024-04-09 DIAGNOSIS — J3089 Other allergic rhinitis: Secondary | ICD-10-CM

## 2024-04-09 DIAGNOSIS — M79641 Pain in right hand: Secondary | ICD-10-CM

## 2024-04-09 DIAGNOSIS — J4540 Moderate persistent asthma, uncomplicated: Secondary | ICD-10-CM | POA: Diagnosis not present

## 2024-04-09 DIAGNOSIS — K219 Gastro-esophageal reflux disease without esophagitis: Secondary | ICD-10-CM

## 2024-04-09 DIAGNOSIS — J302 Other seasonal allergic rhinitis: Secondary | ICD-10-CM | POA: Diagnosis not present

## 2024-04-09 DIAGNOSIS — M7989 Other specified soft tissue disorders: Secondary | ICD-10-CM

## 2024-04-09 MED ORDER — FLUTICASONE PROPIONATE 50 MCG/ACT NA SUSP: 2.0000 | Freq: Every day | NASAL | 1 refills | Status: AC

## 2024-04-09 MED ORDER — GABAPENTIN 100 MG PO CAPS: 100.0000 mg | ORAL_CAPSULE | Freq: Two times a day (BID) | ORAL | 1 refills | Status: AC

## 2024-04-09 NOTE — Patient Instructions (Addendum)
 1. Moderate persistent asthma, uncomplicated - Lung testing looked stable today.  - The Breztri  has helped which is good news.  - Restart gabapentin  100mg  twice daily until the next visit.  - Daily controller medication(s): Breztri  160/9/4.56mcg two puffs twice daily - Prior to physical activity: AirSupra  2 puffs 10-15 minutes before physical activity. - Rescue medications: AirSupra  2 puffs every 4-6 hours as needed - Asthma control goals:  * Full participation in all desired activities (may need albuterol  before activity) * Albuterol  use two time or less a week on average (not counting use with activity) * Cough interfering with sleep two time or less a month * Oral steroids no more than once a year * No hospitalizations  2. Chronic rhinitis - Testing at the last visit showed: grasses, ragweed, weeds, indoor molds, dust mites, cat, and dog - Continue with: Flonase  (fluticasone ) one spray per nostril daily (AIM FOR EAR ON EACH SIDE) - Continue with: Carbinoxamine  4mg  tablet 1-2 times daily (this can cause some sleepiness, so beware of this rare side effect) and Singulair  (montelukast ) 10mg   - Singulair  can cause irritability and bad dreams, so beware of this rare side effect.  - You can use an extra dose of the antihistamine, if needed, for breakthrough symptoms.  - Consider nasal saline rinses 1-2 times daily to remove allergens from the nasal cavities as well as help with mucous clearance (this is especially helpful to do before the nasal sprays are given) - Consider allergy  shots as a means of long-term control. - Allergy  shots re-train and reset the immune system to ignore environmental allergens and decrease the resulting immune response to those allergens (sneezing, itchy watery eyes, runny nose, nasal congestion, etc).    - Allergy  shots improve symptoms in 75-85% of patients.  - This can treat allergies AND asthma.   3. GERD - Continue with pantoprazole  daily. - Continue with  famotidine daily.   4. Return in about 3 months (around 07/10/2024). You can have the follow up appointment with Dr. Iva or a Nurse Practicioner (our Nurse Practitioners are excellent and always have Physician oversight!).    Please inform us  of any Emergency Department visits, hospitalizations, or changes in symptoms. Call us  before going to the ED for breathing or allergy  symptoms since we might be able to fit you in for a sick visit. Feel free to contact us  anytime with any questions, problems, or concerns.  It was a pleasure to meet you today!  Websites that have reliable patient information: 1. American Academy of Asthma, Allergy , and Immunology: www.aaaai.org 2. Food Allergy  Research and Education (FARE): foodallergy.org 3. Mothers of Asthmatics: http://www.asthmacommunitynetwork.org 4. American College of Allergy , Asthma, and Immunology: www.acaai.org      "Like" us  on Facebook and Instagram for our latest updates!      A healthy democracy works best when [employer] participate! Make sure you are registered to vote! If you have moved or changed any of your contact information, you will need to get this updated before voting! Scan the QR codes below to learn more!       Check out this information handout from the [employer] of Asthma, Allergy , and Immunology on rhinitis!   https://rebrand.ly/AAC-Rhinitis       Allergy  Shots  Allergies are the result of a chain reaction that starts in the immune system. Your immune system controls how your body defends itself. For instance, if you have an allergy  to pollen, your immune system identifies pollen as an invader  or allergen. Your immune system overreacts by producing antibodies called Immunoglobulin E (IgE). These antibodies travel to cells that release chemicals, causing an allergic reaction.  The concept behind allergy  immunotherapy, whether it is received in the form of shots or tablets, is that the immune  system can be desensitized to specific allergens that trigger allergy  symptoms. Although it requires time and patience, the payback can be long-term relief. Allergy  injections contain a dilute solution of those substances that you are allergic to based upon your skin testing and allergy  history.   How Do Allergy  Shots Work?  Allergy  shots work much like a vaccine. Your body responds to injected amounts of a particular allergen given in increasing doses, eventually developing a resistance and tolerance to it. Allergy  shots can lead to decreased, minimal or no allergy  symptoms.  There generally are two phases: build-up and maintenance. Build-up often ranges from three to six months and involves receiving injections with increasing amounts of the allergens. The shots are typically given once or twice a week, though more rapid build-up schedules are sometimes used.  The maintenance phase begins when the most effective dose is reached. This dose is different for each person, depending on how allergic you are and your response to the build-up injections. Once the maintenance dose is reached, there are longer periods between injections, typically two to four weeks.  Occasionally doctors give cortisone-type shots that can temporarily reduce allergy  symptoms. These types of shots are different and should not be confused with allergy  immunotherapy shots.  Who Can Be Treated with Allergy  Shots?  Allergy  shots may be a good treatment approach for people with allergic rhinitis (hay fever), allergic asthma, conjunctivitis (eye allergy ) or stinging insect allergy .   Before deciding to begin allergy  shots, you should consider:   The length of allergy  season and the severity of your symptoms  Whether medications and/or changes to your environment can control your symptoms  Your desire to avoid long-term medication use  Time: allergy  immunotherapy requires a major time commitment  Cost: may vary depending on  your insurance coverage  Allergy  shots for children age 90 and older are effective and often well tolerated. They might prevent the onset of new allergen sensitivities or the progression to asthma.  Allergy  shots are not started on patients who are pregnant but can be continued on patients who become pregnant while receiving them. In some patients with other medical conditions or who take certain common medications, allergy  shots may be of risk. It is important to mention other medications you talk to your allergist.   What are the two types of build-ups offered:   RUSH or Rapid Desensitization -- one day of injections lasting from 8:30-4:30pm, injections every 1 hour.  Approximately half of the build-up process is completed in that one day.  The following week, normal build-up is resumed, and this entails ~16 visits either weekly or twice weekly, until reaching your "maintenance dose" which is continued weekly until eventually getting spaced out to every month for a duration of 3 to 5 years. The regular build-up appointments are nurse visits where the injections are administered, followed by required monitoring for 30 minutes.    Traditional build-up -- weekly visits for 6 -12 months until reaching "maintenance dose", then continue weekly until eventually spacing out to every 4 weeks as above. At these appointments, the injections are administered, followed by required monitoring for 30 minutes.     Either way is acceptable, and both are equally effective. With  the rush protocol, the advantage is that less time is spent here for injections overall AND you would also reach maintenance dosing faster (which is when the clinical benefit starts to become more apparent). Not everyone is a candidate for rapid desensitization.   IF we proceed with the RUSH protocol, there are premedications which must be taken the day before and the day after the rush only (this includes antihistamines, steroids, and  Singulair ).  After the rush day, no prednisone  or Singulair  is required, and we just recommend antihistamines taken on your injection day.  What Is An Estimate of the Costs?  If you are interested in starting allergy  injections, please check with your insurance company about your coverage for both allergy  vial sets and allergy  injections.  Please do so prior to making the appointment to start injections.  The following are CPT codes to give to your insurance company. These are the amounts we BILL to the insurance company, but the amount YOU WILL PAY and WE RECEIVE IS SUBSTANTIALLY LESS and depends on the contracts we have with different insurance companies.   Amount Billed to Insurance One allergy  vial set  CPT 95165   $ 1200     Two allergy  vial set  CPT 95165   $ 2400     Three allergy  vial set  CPT 95165   $ 3600     One injection   CPT 95115   $ 35  Two injections   CPT 95117   $ 40 RUSH (Rapid Desensitization) CPT 95180 x 8 hours $500/hour  Regarding the allergy  injections, your co-pay may or may not apply with each injection, so please confirm this with your insurance company. When you start allergy  injections, 1 or 2 sets of vials are made based on your allergies.  Not all patients can be on one set of vials. A set of vials lasts 6 months to a year depending on how quickly you can proceed with your build-up of your allergy  injections. Vials are personalized for each patient depending on their specific allergens.  How often are allergy  injection given during the build-up period?   Injections are given at least weekly during the build-up period until your maintenance dose is achieved. Per the doctor's discretion, you may have the option of getting allergy  injections two times per week during the build-up period. However, there must be at least 48 hours between injections. The build-up period is usually completed within 6-12 months depending on your ability to schedule injections and for  adjustments for reactions. When maintenance dose is reached, your injection schedule is gradually changed to every two weeks and later to every three weeks. Injections will then continue every 4 weeks. Usually, injections are continued for a total of 3-5 years.   When Will I Feel Better?  Some may experience decreased allergy  symptoms during the build-up phase. For others, it may take as long as 12 months on the maintenance dose. If there is no improvement after a year of maintenance, your allergist will discuss other treatment options with you.  If you aren't responding to allergy  shots, it may be because there is not enough dose of the allergen in your vaccine or there are missing allergens that were not identified during your allergy  testing. Other reasons could be that there are high levels of the allergen in your environment or major exposure to non-allergic triggers like tobacco smoke.  What Is the Length of Treatment?  Once the maintenance dose is reached, allergy   shots are generally continued for three to five years. The decision to stop should be discussed with your allergist at that time. Some people may experience a permanent reduction of allergy  symptoms. Others may relapse and a longer course of allergy  shots can be considered.  What Are the Possible Reactions?  The two types of adverse reactions that can occur with allergy  shots are local and systemic. Common local reactions include very mild redness and swelling at the injection site, which can happen immediately or several hours after. Report a delayed reaction from your last injection. These include arm swelling or runny nose, watery eyes or cough that occurs within 12-24 hours after injection. A systemic reaction, which is less common, affects the entire body or a particular body system. They are usually mild and typically respond quickly to medications. Signs include increased allergy  symptoms such as sneezing, a stuffy nose or  hives.   Rarely, a serious systemic reaction called anaphylaxis can develop. Symptoms include swelling in the throat, wheezing, a feeling of tightness in the chest, nausea or dizziness. Most serious systemic reactions develop within 30 minutes of allergy  shots. This is why it is strongly recommended you wait in your doctor's office for 30 minutes after your injections. Your allergist is trained to watch for reactions, and his or her staff is trained and equipped with the proper medications to identify and treat them.   Report to the nurse immediately if you experience any of the following symptoms: swelling, itching or redness of the skin, hives, watery eyes/nose, breathing difficulty, excessive sneezing, coughing, stomach pain, diarrhea, or light headedness. These symptoms may occur within 15-20 minutes after injection and may require medication.   Who Should Administer Allergy  Shots?  The preferred location for receiving shots is your prescribing allergist's office. Injections can sometimes be given at another facility where the physician and staff are trained to recognize and treat reactions, and have received instructions by your prescribing allergist.  What if I am late for an injection?   Injection dose will be adjusted depending upon how many days or weeks you are late for your injection.   What if I am sick?   Please report any illness to the nurse before receiving injections. She may adjust your dose or postpone injections depending on your symptoms. If you have fever, flu, sinus infection or chest congestion it is best to postpone allergy  injections until you are better. Never get an allergy  injection if your asthma is causing you problems. If your symptoms persist, seek out medical care to get your health problem under control.  What If I am or Become Pregnant:  Women that become pregnant should schedule an appointment with [facility] before receiving any further  allergy  injections.

## 2024-04-09 NOTE — Progress Notes (Signed)
 FOLLOW UP  Date of Service/Encounter:  04/09/24   Assessment:   Moderate persistent asthma, uncomplicated - not well controlled, but clearly with multiple triggers including PND and GERD   Perennial and seasonal allergic rhinitis (grasses, ragweed, weeds, indoor molds, dust mites, cat, and dog)  GERD - on pantoprazole  and famotidine  Plan/Recommendations:   1. Moderate persistent asthma, uncomplicated - Lung testing looked stable today.  - The Breztri  has helped which is good news.  - Restart gabapentin  100mg  twice daily until the next visit.  - Daily controller medication(s): Breztri  160/9/4.44mcg two puffs twice daily - Prior to physical activity: AirSupra  2 puffs 10-15 minutes before physical activity. - Rescue medications: AirSupra  2 puffs every 4-6 hours as needed - Asthma control goals:  * Full participation in all desired activities (may need albuterol  before activity) * Albuterol  use two time or less a week on average (not counting use with activity) * Cough interfering with sleep two time or less a month * Oral steroids no more than once a year * No hospitalizations  2. Chronic rhinitis - Testing at the last visit showed: grasses, ragweed, weeds, indoor molds, dust mites, cat, and dog - Continue with: Flonase  (fluticasone ) one spray per nostril daily (AIM FOR EAR ON EACH SIDE) - Continue with: Carbinoxamine  4mg  tablet 1-2 times daily (this can cause some sleepiness, so beware of this rare side effect) and Singulair  (montelukast ) 10mg   - Singulair  can cause irritability and bad dreams, so beware of this rare side effect.  - You can use an extra dose of the antihistamine, if needed, for breakthrough symptoms.  - Consider nasal saline rinses 1-2 times daily to remove allergens from the nasal cavities as well as help with mucous clearance (this is especially helpful to do before the nasal sprays are given) - Consider allergy  shots as a means of long-term control. -  Allergy  shots re-train and reset the immune system to ignore environmental allergens and decrease the resulting immune response to those allergens (sneezing, itchy watery eyes, runny nose, nasal congestion, etc).    - Allergy  shots improve symptoms in 75-85% of patients.  - This can treat allergies AND asthma.   3. GERD - Continue with pantoprazole  daily. - Continue with famotidine daily.   4. Return in about 3 months (around 07/10/2024). You can have the follow up appointment with Dr. Iva or a Nurse Practicioner (our Nurse Practitioners are excellent and always have Physician oversight!).    Subjective:   Isabella Juarez is a 58 y.o. female presenting today for follow up of  Chief Complaint  Patient presents with   Follow-up   Cough    Clearing throat    Pain    Chest and neck     Isabella Juarez has a history of the following: Patient Active Problem List   Diagnosis Date Noted   Upper airway cough syndrome vs Cough variant asthma 10/09/2023   Overactive bladder 04/19/2020   Morbid obesity (HCC) 07/15/2014   Gastroesophageal reflux disease without esophagitis 02/14/2014   Essential hypertension, benign 11/09/2013   Hyperlipidemia 08/13/2013   Type 2 diabetes mellitus (HCC) 08/13/2013   Anxiety and depression 08/13/2013   NAFLD (nonalcoholic fatty liver disease) 87/96/7987   Obesity 04/23/2011    History obtained from: chart review and patient.  Discussed the use of AI scribe software for clinical note transcription with the patient and/or guardian, who gave verbal consent to proceed.  Isabella Juarez is a 58 y.o. female presenting for a  follow up visit.  She was last seen in September 2025.  At that time, Lyme testing was not done.  We started her on Breztri .  Last continue with Airsupra  as needed.  She had testing that was positive to multiple indoor and outdoor allergens.  We stopped the chlorpheniramine and continue with Flonase .  Restart carbinoxamine  4 mg 1-2 times  daily.  Since the last visit, she has done well.   Asthma/Respiratory Symptom History: She has a persistent cough that has worsened over the past few weeks. Initially, her medication regimen, including Breztri , carbinoxamine , and gabapentin , provided relief, but the cough has recently intensified, disturbing her sleep and causing neck soreness. The cough is described as 'wet' and sometimes makes her chest feel 'scratchy.' She uses Air Supra when her chest feels funny, which provides some relief, and takes promethazine  at night when the cough is severe to help her sleep.  Her medication regimen includes gabapentin , which she takes at a dose of 100 mg four times daily, although she has been taking it twice daily due to difficulty remembering the four times daily schedule. Gabapentin  helps with her cough, as observed by her coworker when she attempted to taper off the medication.   She has been taking Breztri  twice daily, carbinoxamine , and gabapentin . Initially, these medications provided relief, but the symptoms have returned.   She has undergone a chest x-ray but not a CT scan. She experiences itchy and dry eyes, for which she uses over-the-counter dry eye drops. She has previously tried a prescription dry eye medication that was expensive and caused stinging, so she reverted to using regular dry eye drops.  Allergic Rhinitis Symptom History: She remains on the carbinoxamine  daily. She also uses a nasal spray, for which she needs a prescription refill. Missing a few doses of her medications around the time of her recent surgery may have contributed to the worsening of her symptoms.  GERD Symptom History: She also takes pantoprazole  and famotidine for heartburn.  No neck injuries but neck soreness from coughing. She does have itchy and dry eyes. No runny nose and no significant change in symptoms with weather changes.   Otherwise, there have been no changes to her past medical history, surgical  history, family history, or social history.    Review of systems otherwise negative other than that mentioned in the HPI.    Objective:   Blood pressure 102/62, pulse 94, temperature 98.1 F (36.7 C), SpO2 97%. There is no height or weight on file to calculate BMI.    Physical Exam Vitals reviewed.  Constitutional:      Appearance: She is well-developed.     Comments: Talkative. Dry cough throughout the visit.   HENT:     Head: Normocephalic and atraumatic.     Right Ear: Tympanic membrane, ear canal and external ear normal. No drainage, swelling or tenderness. Tympanic membrane is not injected, scarred, erythematous, retracted or bulging.     Left Ear: Tympanic membrane, ear canal and external ear normal. No drainage, swelling or tenderness. Tympanic membrane is not injected, scarred, erythematous, retracted or bulging.     Nose: No nasal deformity, septal deviation, mucosal edema or rhinorrhea.     Right Turbinates: Enlarged, swollen and pale.     Left Turbinates: Enlarged, swollen and pale.     Right Sinus: No maxillary sinus tenderness or frontal sinus tenderness.     Left Sinus: No maxillary sinus tenderness or frontal sinus tenderness.     Comments: No polyps  noted. Turbinates are a bit erythematous.     Mouth/Throat:     Lips: Pink.     Mouth: Mucous membranes are moist. Mucous membranes are not pale and not dry.     Pharynx: Uvula midline.     Comments: Cobblestoning in the posterior oropharynx.  Eyes:     General:        Right eye: No discharge.        Left eye: No discharge.     Conjunctiva/sclera: Conjunctivae normal.     Right eye: Right conjunctiva is not injected. No chemosis.    Left eye: Left conjunctiva is not injected. No chemosis.    Pupils: Pupils are equal, round, and reactive to light.  Cardiovascular:     Rate and Rhythm: Normal rate and regular rhythm.     Heart sounds: Normal heart sounds.  Pulmonary:     Effort: Pulmonary effort is normal. No  tachypnea, accessory muscle usage or respiratory distress.     Breath sounds: Examination of the right-lower field reveals decreased breath sounds. Examination of the left-lower field reveals decreased breath sounds. Decreased breath sounds present. No wheezing, rhonchi or rales.     Comments: Dry cough. Unable to take deeper breaths.  Chest:     Chest wall: No tenderness.  Abdominal:     Tenderness: There is no abdominal tenderness. There is no guarding or rebound.  Lymphadenopathy:     Head:     Right side of head: No submandibular, tonsillar or occipital adenopathy.     Left side of head: No submandibular, tonsillar or occipital adenopathy.     Cervical: No cervical adenopathy.  Skin:    General: Skin is warm.     Capillary Refill: Capillary refill takes less than 2 seconds.     Coloration: Skin is not pale.     Findings: No abrasion, erythema, petechiae or rash. Rash is not papular, urticarial or vesicular.  Neurological:     Mental Status: She is alert.  Psychiatric:        Behavior: Behavior is cooperative.      Diagnostic studies:    Spirometry: results normal (FEV1: 2.33/96%, FVC: 2.86/93%, FEV1/FVC: 81%).    Spirometry consistent with normal pattern.    Allergy  Studies: none      Marty Shaggy, MD  [facility] 

## 2024-04-13 ENCOUNTER — Encounter: Payer: Self-pay | Admitting: Allergy & Immunology

## 2024-04-15 ENCOUNTER — Encounter: Payer: Self-pay | Admitting: Allergy & Immunology

## 2024-04-15 ENCOUNTER — Ambulatory Visit: Admitting: Allergy & Immunology

## 2024-04-22 ENCOUNTER — Telehealth: Payer: Self-pay | Admitting: *Deleted

## 2024-04-22 MED ORDER — TEZSPIRE 210 MG/1.91ML ~~LOC~~ SOAJ: 210.0000 mg | SUBCUTANEOUS | 11 refills | Status: AC

## 2024-04-22 NOTE — Telephone Encounter (Signed)
-----   Message from Marty Morton Shaggy sent at 04/15/2024  5:14 PM EST ----- She had previously been doing a bit better on the gabapentin , but she ran out of it.  She was begging for more.  I would rather her Tezspir and get her off of gabapentin .  She knows that gabapentine is not a good long-term solution.

## 2024-04-22 NOTE — Telephone Encounter (Signed)
 Called patient and advised approval, copay card and submit to [employer] for Tezspire instructed on delivery, storage, dosing and initial inj in clinic with admin instruction

## 2024-04-22 NOTE — Telephone Encounter (Signed)
 Thanks, Tam Tam!   Malachi Bonds, MD [facility]

## 2024-05-01 ENCOUNTER — Other Ambulatory Visit: Payer: Self-pay

## 2024-05-01 ENCOUNTER — Other Ambulatory Visit

## 2024-05-01 MED ORDER — EPINEPHRINE 0.3 MG/0.3ML IJ SOAJ: 0.3000 mg | INTRAMUSCULAR | 1 refills | Status: AC | PRN

## 2024-05-01 NOTE — Progress Notes (Signed)
 Epipen sent in for allergy  injection protocol.

## 2024-05-02 ENCOUNTER — Ambulatory Visit (HOSPITAL_COMMUNITY)

## 2024-05-05 ENCOUNTER — Telehealth: Payer: Self-pay | Admitting: Allergy & Immunology

## 2024-05-05 ENCOUNTER — Inpatient Hospital Stay: Admission: RE | Admit: 2024-05-05 | Discharge: 2024-05-05

## 2024-05-05 DIAGNOSIS — M7989 Other specified soft tissue disorders: Secondary | ICD-10-CM

## 2024-05-05 DIAGNOSIS — M79641 Pain in right hand: Secondary | ICD-10-CM

## 2024-05-05 DIAGNOSIS — J302 Other seasonal allergic rhinitis: Secondary | ICD-10-CM

## 2024-05-05 NOTE — Telephone Encounter (Signed)
 Patient is starting allergy  shots.  Prescription written.

## 2024-05-06 NOTE — Progress Notes (Unsigned)
 Aeroallergen Immunotherapy  Ordering Provider: Marty Shaggy, MD  Patient Details Name: TALULAH SCHIRMER MRN: [id number] Date of Birth: 22-Sep-1965  Order 2 of 2  Vial Label: G/W/C/D  0.2 ml (Volume)  1:20 Concentration -- [place] 0.6 ml (Volume)  1:20 Concentration -- Ragweed Mix 0.5 ml (Volume)  1:20 Concentration -- Weed Mix* 0.8 ml (Volume)  1:10 Concentration -- Cat Hair 0.8 ml (Volume)  1:10 Concentration -- Dog Epithelia   2.9  ml Extract Subtotal 2.1  ml Diluent  5.0  ml Maintenance Total  Schedule:  B Pink Vial (1:100,000): Silver Vial (1:10,000): Green Vial (1:1,000): Schedule B (6 doses) Blue Vial (1:100): Schedule B (6 doses) Yellow Vial (1:10): Schedule B (6 doses) Red Vial (1:1): Schedule A (14 doses)  Special Instructions: After completion of the first Red Vial, please space to every two weeks. After completion of the second Red Vial, please space to every 4 weeks. Ok to up dose new vials on Schedule D. Ok to come twice weekly, if desired, as long as there is 48 hours between injections.

## 2024-05-06 NOTE — Progress Notes (Deleted)
 Aeroallergen Immunotherapy  Ordering Provider: Rocky Endow, MD  Patient Details Name: Isabella Juarez MRN: [id number] Date of Birth: 09/17/1965  Order 1 of 2  Vial Label: Molds/DM  0.2 ml (Volume)  1:10 Concentration -- Aspergillus mix 0.2 ml (Volume)  1:10 Concentration -- Penicillium mix 0.2 ml (Volume)  1:10 Concentration -- Fusarium moniliforme 0.2 ml (Volume)  1:40 Concentration -- Aureobasidium pullulans 0.2 ml (Volume)  1:10 Concentration -- Rhizopus oryzae 1.0 ml (Volume)   AU Concentration -- Mite Mix (DF 5,000 & DP 5,000)   2.0  ml Extract Subtotal 3.0  ml Diluent  5.0  ml Maintenance Total  Schedule:  B  Green Vial (1:1,000): Schedule B (6 doses) Blue Vial (1:100): Schedule B (6 doses) Yellow Vial (1:10): Schedule B (6 doses) Red Vial (1:1): Schedule A (14 doses)  Special Instructions: After completion of the first Red Vial, please space to every two weeks. After completion of the second Red Vial, please space to every 4 weeks. Ok to up dose new vials on Schedule D. Ok to come twice weekly, if desired, as long as there is 48 hours between injections.

## 2024-05-06 NOTE — Progress Notes (Unsigned)
 VIALS MADE ON 05/06/24

## 2024-05-07 DIAGNOSIS — J301 Allergic rhinitis due to pollen: Secondary | ICD-10-CM | POA: Diagnosis not present

## 2024-05-07 DIAGNOSIS — J302 Other seasonal allergic rhinitis: Secondary | ICD-10-CM | POA: Diagnosis not present

## 2024-05-07 DIAGNOSIS — J3089 Other allergic rhinitis: Secondary | ICD-10-CM | POA: Diagnosis not present

## 2024-05-07 DIAGNOSIS — J3081 Allergic rhinitis due to animal (cat) (dog) hair and dander: Secondary | ICD-10-CM | POA: Diagnosis not present

## 2024-05-07 NOTE — Progress Notes (Signed)
 Aeroallergen Immunotherapy  Ordering Provider: Marty Shaggy, MD  Patient Details Name: Isabella Juarez MRN: [id number] Date of Birth: [birth year]  Order 1 of 2  Vial Label: Molds/DM  0.2 ml (Volume)  1:10 Concentration -- Aspergillus mix 0.2 ml (Volume)  1:10 Concentration -- Penicillium mix 0.2 ml (Volume)  1:10 Concentration -- Fusarium moniliforme 0.2 ml (Volume)  1:40 Concentration -- Aureobasidium pullulans 0.2 ml (Volume)  1:10 Concentration -- Rhizopus oryzae 1.0 ml (Volume)   AU Concentration -- Mite Mix (DF 5,000 & DP 5,000)   2.0  ml Extract Subtotal 3.0  ml Diluent  5.0  ml Maintenance Total  Schedule:  B  Green Vial (1:1,000): Schedule B (6 doses) Blue Vial (1:100): Schedule B (6 doses) Yellow Vial (1:10): Schedule B (6 doses) Red Vial (1:1): Schedule A (14 doses)  Special Instructions: After completion of the first Red Vial, please space to every two weeks. After completion of the second Red Vial, please space to every 4 weeks. Ok to up dose new vials on Schedule D. Ok to come twice weekly, if desired, as long as there is 48 hours between injections.

## 2024-05-11 ENCOUNTER — Ambulatory Visit

## 2024-05-11 DIAGNOSIS — J4540 Moderate persistent asthma, uncomplicated: Secondary | ICD-10-CM

## 2024-05-11 MED ORDER — TEZEPELUMAB-EKKO 210 MG/1.91ML ~~LOC~~ SOSY
210.0000 mg | PREFILLED_SYRINGE | Freq: Once | SUBCUTANEOUS | Status: AC
  Administered 2024-05-11: 210 mg via SUBCUTANEOUS

## 2024-05-11 NOTE — Progress Notes (Signed)
"                                                                        Immunotherapy   Patient Details  Name: Isabella Juarez MRN: [id number] Date of Birth: 16-Oct-1965  05/11/2024  [name] started Tezspire  injections for her Asthma. Frequency:Every 4 weeks  Consent signed and patient instructions given. Provided injection administration teaching. Patient administered the injection to herself. She waited in office 15 minutes after administration with no issues. She will now be administering at home every 4 weeks.    [name] 05/11/2024, 1:44 PM   "

## 2024-05-17 ENCOUNTER — Other Ambulatory Visit

## 2024-05-29 ENCOUNTER — Ambulatory Visit

## 2024-05-29 DIAGNOSIS — J302 Other seasonal allergic rhinitis: Secondary | ICD-10-CM | POA: Diagnosis not present

## 2024-05-29 NOTE — Progress Notes (Signed)
"                                                                        Immunotherapy   Patient Details  Name: Isabella Juarez MRN: [id number] Date of Birth: [birth year]  05/29/2024  Isabella Juarez started injections for  Molds/DM, G/W/C/D Following schedule: B  Frequency:1-2x weekly Epi-Pen:Epi-Pen Available  Consent signed and patient instructions given.Patient waited 30 minutes after injection administration with no issues.    Isabella Juarez 05/29/2024, 10:48 AM   "

## 2024-06-03 ENCOUNTER — Ambulatory Visit (INDEPENDENT_AMBULATORY_CARE_PROVIDER_SITE_OTHER)

## 2024-06-03 DIAGNOSIS — J302 Other seasonal allergic rhinitis: Secondary | ICD-10-CM

## 2024-06-05 ENCOUNTER — Ambulatory Visit (INDEPENDENT_AMBULATORY_CARE_PROVIDER_SITE_OTHER)

## 2024-06-05 DIAGNOSIS — J302 Other seasonal allergic rhinitis: Secondary | ICD-10-CM

## 2024-06-12 ENCOUNTER — Ambulatory Visit (INDEPENDENT_AMBULATORY_CARE_PROVIDER_SITE_OTHER)

## 2024-06-12 DIAGNOSIS — J302 Other seasonal allergic rhinitis: Secondary | ICD-10-CM | POA: Diagnosis not present

## 2024-06-17 ENCOUNTER — Ambulatory Visit

## 2024-06-17 DIAGNOSIS — J302 Other seasonal allergic rhinitis: Secondary | ICD-10-CM

## 2024-06-19 ENCOUNTER — Ambulatory Visit

## 2024-06-19 DIAGNOSIS — J302 Other seasonal allergic rhinitis: Secondary | ICD-10-CM | POA: Diagnosis not present

## 2024-06-24 ENCOUNTER — Telehealth: Payer: Self-pay

## 2024-06-24 ENCOUNTER — Ambulatory Visit

## 2024-06-24 DIAGNOSIS — J302 Other seasonal allergic rhinitis: Secondary | ICD-10-CM

## 2024-06-24 MED ORDER — CARBINOXAMINE MALEATE 4 MG PO TABS: ORAL_TABLET | ORAL | 1 refills | Status: AC

## 2024-06-24 NOTE — Telephone Encounter (Signed)
 Called patient and advised make sure [employer] files copay to copay card

## 2024-06-24 NOTE — Telephone Encounter (Signed)
 Patient came in stating that when she went to refill her Tezspire  the pharmacy gave her a price of $500. She wanted to know if that was normal. Informed I would forward message to Tammy.

## 2024-06-26 ENCOUNTER — Ambulatory Visit (INDEPENDENT_AMBULATORY_CARE_PROVIDER_SITE_OTHER)

## 2024-06-26 DIAGNOSIS — J302 Other seasonal allergic rhinitis: Secondary | ICD-10-CM
